# Patient Record
Sex: Female | Born: 1955
Health system: Southern US, Community
[De-identification: ages and names within clinical notes are randomized; demographics above are authoritative.]

## PROBLEM LIST (undated history)

## (undated) DIAGNOSIS — J45909 Unspecified asthma, uncomplicated: Secondary | ICD-10-CM

## (undated) DIAGNOSIS — F32A Depression, unspecified: Secondary | ICD-10-CM

## (undated) HISTORY — PX: KNEE SURGERY: SHX244

## (undated) HISTORY — PX: SHOULDER SURGERY: SHX246

## (undated) HISTORY — DX: Unspecified asthma, uncomplicated: J45.909

## (undated) HISTORY — PX: CHOLECYSTECTOMY: SHX55

## (undated) HISTORY — PX: ABDOMINAL HYSTERECTOMY: SHX81

## (undated) HISTORY — PX: FOOT SURGERY: SHX648

## (undated) HISTORY — DX: Depression, unspecified: F32.A

---

## 1998-03-10 ENCOUNTER — Inpatient Hospital Stay (HOSPITAL_COMMUNITY): Admission: EM | Admit: 1998-03-10 | Discharge: 1998-03-22 | Payer: Self-pay | Admitting: Specialist

## 1998-10-15 ENCOUNTER — Encounter: Payer: Self-pay | Admitting: Obstetrics and Gynecology

## 1998-10-17 ENCOUNTER — Inpatient Hospital Stay (HOSPITAL_COMMUNITY): Admission: RE | Admit: 1998-10-17 | Discharge: 1998-10-19 | Payer: Self-pay | Admitting: Obstetrics and Gynecology

## 2000-01-08 ENCOUNTER — Inpatient Hospital Stay (HOSPITAL_COMMUNITY): Admission: EM | Admit: 2000-01-08 | Discharge: 2000-01-19 | Payer: Self-pay | Admitting: *Deleted

## 2000-05-10 ENCOUNTER — Inpatient Hospital Stay (HOSPITAL_COMMUNITY): Admission: EM | Admit: 2000-05-10 | Discharge: 2000-05-31 | Payer: Self-pay | Admitting: *Deleted

## 2015-07-29 DIAGNOSIS — M2042 Other hammer toe(s) (acquired), left foot: Secondary | ICD-10-CM

## 2015-07-29 DIAGNOSIS — E119 Type 2 diabetes mellitus without complications: Secondary | ICD-10-CM

## 2015-07-29 DIAGNOSIS — L600 Ingrowing nail: Secondary | ICD-10-CM | POA: Insufficient documentation

## 2015-07-29 DIAGNOSIS — M2041 Other hammer toe(s) (acquired), right foot: Secondary | ICD-10-CM

## 2015-07-29 HISTORY — DX: Type 2 diabetes mellitus without complications: E11.9

## 2015-07-29 HISTORY — DX: Ingrowing nail: L60.0

## 2015-07-29 HISTORY — DX: Other hammer toe(s) (acquired), right foot: M20.41

## 2015-07-29 HISTORY — DX: Other hammer toe(s) (acquired), left foot: M20.42

## 2016-02-26 DIAGNOSIS — D239 Other benign neoplasm of skin, unspecified: Secondary | ICD-10-CM

## 2016-02-26 HISTORY — DX: Other benign neoplasm of skin, unspecified: D23.9

## 2016-03-18 DIAGNOSIS — L720 Epidermal cyst: Secondary | ICD-10-CM | POA: Insufficient documentation

## 2016-03-18 HISTORY — DX: Epidermal cyst: L72.0

## 2016-08-20 DIAGNOSIS — E119 Type 2 diabetes mellitus without complications: Secondary | ICD-10-CM

## 2016-08-20 DIAGNOSIS — J9620 Acute and chronic respiratory failure, unspecified whether with hypoxia or hypercapnia: Secondary | ICD-10-CM

## 2016-08-20 DIAGNOSIS — J441 Chronic obstructive pulmonary disease with (acute) exacerbation: Secondary | ICD-10-CM

## 2016-08-20 DIAGNOSIS — M549 Dorsalgia, unspecified: Secondary | ICD-10-CM

## 2016-08-20 DIAGNOSIS — E669 Obesity, unspecified: Secondary | ICD-10-CM | POA: Diagnosis not present

## 2016-08-20 DIAGNOSIS — F419 Anxiety disorder, unspecified: Secondary | ICD-10-CM

## 2016-08-21 DIAGNOSIS — E669 Obesity, unspecified: Secondary | ICD-10-CM | POA: Diagnosis not present

## 2016-08-21 DIAGNOSIS — G43909 Migraine, unspecified, not intractable, without status migrainosus: Secondary | ICD-10-CM | POA: Diagnosis not present

## 2016-08-21 DIAGNOSIS — J9620 Acute and chronic respiratory failure, unspecified whether with hypoxia or hypercapnia: Secondary | ICD-10-CM | POA: Diagnosis not present

## 2016-08-21 DIAGNOSIS — J441 Chronic obstructive pulmonary disease with (acute) exacerbation: Secondary | ICD-10-CM | POA: Diagnosis not present

## 2016-08-21 DIAGNOSIS — F419 Anxiety disorder, unspecified: Secondary | ICD-10-CM | POA: Diagnosis not present

## 2016-08-21 DIAGNOSIS — E119 Type 2 diabetes mellitus without complications: Secondary | ICD-10-CM | POA: Diagnosis not present

## 2016-08-21 DIAGNOSIS — M549 Dorsalgia, unspecified: Secondary | ICD-10-CM | POA: Diagnosis not present

## 2016-08-22 DIAGNOSIS — J440 Chronic obstructive pulmonary disease with acute lower respiratory infection: Secondary | ICD-10-CM

## 2016-08-22 DIAGNOSIS — J441 Chronic obstructive pulmonary disease with (acute) exacerbation: Secondary | ICD-10-CM | POA: Diagnosis not present

## 2016-08-22 DIAGNOSIS — E119 Type 2 diabetes mellitus without complications: Secondary | ICD-10-CM | POA: Diagnosis not present

## 2017-02-12 DIAGNOSIS — J438 Other emphysema: Secondary | ICD-10-CM

## 2017-02-12 DIAGNOSIS — Z8673 Personal history of transient ischemic attack (TIA), and cerebral infarction without residual deficits: Secondary | ICD-10-CM

## 2017-02-12 DIAGNOSIS — I119 Hypertensive heart disease without heart failure: Secondary | ICD-10-CM

## 2017-02-12 DIAGNOSIS — F17200 Nicotine dependence, unspecified, uncomplicated: Secondary | ICD-10-CM

## 2017-02-12 DIAGNOSIS — E785 Hyperlipidemia, unspecified: Secondary | ICD-10-CM

## 2017-02-12 DIAGNOSIS — I10 Essential (primary) hypertension: Secondary | ICD-10-CM

## 2017-02-12 HISTORY — DX: Personal history of transient ischemic attack (TIA), and cerebral infarction without residual deficits: Z86.73

## 2017-02-12 HISTORY — DX: Essential (primary) hypertension: I10

## 2017-02-12 HISTORY — DX: Other emphysema: J43.8

## 2017-02-12 HISTORY — DX: Nicotine dependence, unspecified, uncomplicated: F17.200

## 2017-02-12 HISTORY — DX: Hypertensive heart disease without heart failure: I11.9

## 2017-02-12 HISTORY — DX: Hyperlipidemia, unspecified: E78.5

## 2017-09-02 DIAGNOSIS — J101 Influenza due to other identified influenza virus with other respiratory manifestations: Secondary | ICD-10-CM

## 2017-09-02 DIAGNOSIS — R0602 Shortness of breath: Secondary | ICD-10-CM

## 2018-02-23 DIAGNOSIS — I10 Essential (primary) hypertension: Secondary | ICD-10-CM | POA: Diagnosis not present

## 2018-02-23 DIAGNOSIS — E1165 Type 2 diabetes mellitus with hyperglycemia: Secondary | ICD-10-CM

## 2018-02-23 DIAGNOSIS — Z720 Tobacco use: Secondary | ICD-10-CM | POA: Diagnosis not present

## 2018-02-23 DIAGNOSIS — J441 Chronic obstructive pulmonary disease with (acute) exacerbation: Secondary | ICD-10-CM

## 2018-02-23 DIAGNOSIS — G4733 Obstructive sleep apnea (adult) (pediatric): Secondary | ICD-10-CM | POA: Diagnosis not present

## 2018-02-23 DIAGNOSIS — E785 Hyperlipidemia, unspecified: Secondary | ICD-10-CM

## 2018-07-17 ENCOUNTER — Encounter: Payer: Self-pay | Admitting: Internal Medicine

## 2018-08-30 DIAGNOSIS — B349 Viral infection, unspecified: Secondary | ICD-10-CM | POA: Diagnosis not present

## 2018-08-30 DIAGNOSIS — J441 Chronic obstructive pulmonary disease with (acute) exacerbation: Secondary | ICD-10-CM | POA: Diagnosis not present

## 2018-08-30 DIAGNOSIS — Z720 Tobacco use: Secondary | ICD-10-CM

## 2018-08-30 DIAGNOSIS — R0603 Acute respiratory distress: Secondary | ICD-10-CM

## 2018-08-30 DIAGNOSIS — I10 Essential (primary) hypertension: Secondary | ICD-10-CM

## 2018-08-30 DIAGNOSIS — E1169 Type 2 diabetes mellitus with other specified complication: Secondary | ICD-10-CM

## 2018-08-31 DIAGNOSIS — R0603 Acute respiratory distress: Secondary | ICD-10-CM | POA: Diagnosis not present

## 2018-08-31 DIAGNOSIS — B349 Viral infection, unspecified: Secondary | ICD-10-CM | POA: Diagnosis not present

## 2018-08-31 DIAGNOSIS — I10 Essential (primary) hypertension: Secondary | ICD-10-CM | POA: Diagnosis not present

## 2018-08-31 DIAGNOSIS — J441 Chronic obstructive pulmonary disease with (acute) exacerbation: Secondary | ICD-10-CM | POA: Diagnosis not present

## 2018-09-01 DIAGNOSIS — J441 Chronic obstructive pulmonary disease with (acute) exacerbation: Secondary | ICD-10-CM | POA: Diagnosis not present

## 2018-09-01 DIAGNOSIS — I10 Essential (primary) hypertension: Secondary | ICD-10-CM | POA: Diagnosis not present

## 2018-09-01 DIAGNOSIS — R0603 Acute respiratory distress: Secondary | ICD-10-CM | POA: Diagnosis not present

## 2018-09-01 DIAGNOSIS — B349 Viral infection, unspecified: Secondary | ICD-10-CM | POA: Diagnosis not present

## 2019-02-14 DIAGNOSIS — F17210 Nicotine dependence, cigarettes, uncomplicated: Secondary | ICD-10-CM | POA: Diagnosis not present

## 2019-02-14 DIAGNOSIS — F411 Generalized anxiety disorder: Secondary | ICD-10-CM | POA: Diagnosis not present

## 2019-02-14 DIAGNOSIS — G4730 Sleep apnea, unspecified: Secondary | ICD-10-CM | POA: Diagnosis not present

## 2019-02-14 DIAGNOSIS — J449 Chronic obstructive pulmonary disease, unspecified: Secondary | ICD-10-CM | POA: Diagnosis not present

## 2019-02-14 DIAGNOSIS — J310 Chronic rhinitis: Secondary | ICD-10-CM | POA: Diagnosis not present

## 2019-02-23 DIAGNOSIS — J449 Chronic obstructive pulmonary disease, unspecified: Secondary | ICD-10-CM | POA: Diagnosis not present

## 2019-02-25 DIAGNOSIS — J449 Chronic obstructive pulmonary disease, unspecified: Secondary | ICD-10-CM | POA: Diagnosis not present

## 2019-03-02 DIAGNOSIS — J449 Chronic obstructive pulmonary disease, unspecified: Secondary | ICD-10-CM | POA: Diagnosis not present

## 2019-03-20 DIAGNOSIS — Z6830 Body mass index (BMI) 30.0-30.9, adult: Secondary | ICD-10-CM | POA: Diagnosis not present

## 2019-03-20 DIAGNOSIS — E2839 Other primary ovarian failure: Secondary | ICD-10-CM | POA: Diagnosis not present

## 2019-03-20 DIAGNOSIS — I10 Essential (primary) hypertension: Secondary | ICD-10-CM | POA: Diagnosis not present

## 2019-03-20 DIAGNOSIS — Z1231 Encounter for screening mammogram for malignant neoplasm of breast: Secondary | ICD-10-CM | POA: Diagnosis not present

## 2019-03-20 DIAGNOSIS — E1169 Type 2 diabetes mellitus with other specified complication: Secondary | ICD-10-CM | POA: Diagnosis not present

## 2019-03-20 DIAGNOSIS — J449 Chronic obstructive pulmonary disease, unspecified: Secondary | ICD-10-CM | POA: Diagnosis not present

## 2019-03-20 DIAGNOSIS — E785 Hyperlipidemia, unspecified: Secondary | ICD-10-CM | POA: Diagnosis not present

## 2019-03-22 DIAGNOSIS — F17210 Nicotine dependence, cigarettes, uncomplicated: Secondary | ICD-10-CM | POA: Diagnosis not present

## 2019-03-22 DIAGNOSIS — G4730 Sleep apnea, unspecified: Secondary | ICD-10-CM | POA: Diagnosis not present

## 2019-03-22 DIAGNOSIS — J449 Chronic obstructive pulmonary disease, unspecified: Secondary | ICD-10-CM | POA: Diagnosis not present

## 2019-03-22 DIAGNOSIS — J310 Chronic rhinitis: Secondary | ICD-10-CM | POA: Diagnosis not present

## 2019-03-22 DIAGNOSIS — F411 Generalized anxiety disorder: Secondary | ICD-10-CM | POA: Diagnosis not present

## 2019-03-25 DIAGNOSIS — J449 Chronic obstructive pulmonary disease, unspecified: Secondary | ICD-10-CM | POA: Diagnosis not present

## 2019-03-27 DIAGNOSIS — E785 Hyperlipidemia, unspecified: Secondary | ICD-10-CM | POA: Diagnosis not present

## 2019-03-27 DIAGNOSIS — M810 Age-related osteoporosis without current pathological fracture: Secondary | ICD-10-CM | POA: Diagnosis not present

## 2019-03-27 DIAGNOSIS — M85851 Other specified disorders of bone density and structure, right thigh: Secondary | ICD-10-CM | POA: Diagnosis not present

## 2019-03-27 DIAGNOSIS — Z79899 Other long term (current) drug therapy: Secondary | ICD-10-CM | POA: Diagnosis not present

## 2019-03-27 DIAGNOSIS — E1169 Type 2 diabetes mellitus with other specified complication: Secondary | ICD-10-CM | POA: Diagnosis not present

## 2019-03-27 DIAGNOSIS — J449 Chronic obstructive pulmonary disease, unspecified: Secondary | ICD-10-CM | POA: Diagnosis not present

## 2019-03-27 DIAGNOSIS — E2839 Other primary ovarian failure: Secondary | ICD-10-CM | POA: Diagnosis not present

## 2019-04-04 DIAGNOSIS — J449 Chronic obstructive pulmonary disease, unspecified: Secondary | ICD-10-CM | POA: Diagnosis not present

## 2019-04-05 DIAGNOSIS — R809 Proteinuria, unspecified: Secondary | ICD-10-CM | POA: Diagnosis not present

## 2019-04-14 DIAGNOSIS — G4733 Obstructive sleep apnea (adult) (pediatric): Secondary | ICD-10-CM | POA: Diagnosis not present

## 2019-04-14 DIAGNOSIS — J449 Chronic obstructive pulmonary disease, unspecified: Secondary | ICD-10-CM | POA: Diagnosis not present

## 2019-04-14 DIAGNOSIS — I10 Essential (primary) hypertension: Secondary | ICD-10-CM | POA: Diagnosis not present

## 2019-04-17 DIAGNOSIS — I739 Peripheral vascular disease, unspecified: Secondary | ICD-10-CM

## 2019-04-17 DIAGNOSIS — G8929 Other chronic pain: Secondary | ICD-10-CM | POA: Diagnosis not present

## 2019-04-17 DIAGNOSIS — M79674 Pain in right toe(s): Secondary | ICD-10-CM | POA: Diagnosis not present

## 2019-04-17 DIAGNOSIS — E1142 Type 2 diabetes mellitus with diabetic polyneuropathy: Secondary | ICD-10-CM | POA: Insufficient documentation

## 2019-04-17 DIAGNOSIS — E1165 Type 2 diabetes mellitus with hyperglycemia: Secondary | ICD-10-CM | POA: Diagnosis not present

## 2019-04-17 HISTORY — DX: Other chronic pain: G89.29

## 2019-04-17 HISTORY — DX: Peripheral vascular disease, unspecified: I73.9

## 2019-04-17 HISTORY — DX: Pain in right toe(s): M79.674

## 2019-04-17 HISTORY — DX: Type 2 diabetes mellitus with hyperglycemia: E11.42

## 2019-04-25 DIAGNOSIS — J449 Chronic obstructive pulmonary disease, unspecified: Secondary | ICD-10-CM | POA: Diagnosis not present

## 2019-04-27 DIAGNOSIS — J449 Chronic obstructive pulmonary disease, unspecified: Secondary | ICD-10-CM | POA: Diagnosis not present

## 2019-04-28 DIAGNOSIS — J449 Chronic obstructive pulmonary disease, unspecified: Secondary | ICD-10-CM | POA: Diagnosis not present

## 2019-05-19 DIAGNOSIS — J301 Allergic rhinitis due to pollen: Secondary | ICD-10-CM | POA: Diagnosis not present

## 2019-05-19 DIAGNOSIS — F411 Generalized anxiety disorder: Secondary | ICD-10-CM | POA: Diagnosis not present

## 2019-05-19 DIAGNOSIS — J310 Chronic rhinitis: Secondary | ICD-10-CM | POA: Diagnosis not present

## 2019-05-19 DIAGNOSIS — J449 Chronic obstructive pulmonary disease, unspecified: Secondary | ICD-10-CM | POA: Diagnosis not present

## 2019-05-19 DIAGNOSIS — G4730 Sleep apnea, unspecified: Secondary | ICD-10-CM | POA: Diagnosis not present

## 2019-05-19 DIAGNOSIS — J9611 Chronic respiratory failure with hypoxia: Secondary | ICD-10-CM | POA: Diagnosis not present

## 2019-05-19 DIAGNOSIS — F17290 Nicotine dependence, other tobacco product, uncomplicated: Secondary | ICD-10-CM | POA: Diagnosis not present

## 2019-05-25 DIAGNOSIS — J449 Chronic obstructive pulmonary disease, unspecified: Secondary | ICD-10-CM | POA: Diagnosis not present

## 2019-05-27 DIAGNOSIS — J449 Chronic obstructive pulmonary disease, unspecified: Secondary | ICD-10-CM | POA: Diagnosis not present

## 2019-06-19 DIAGNOSIS — M810 Age-related osteoporosis without current pathological fracture: Secondary | ICD-10-CM | POA: Diagnosis not present

## 2019-06-19 DIAGNOSIS — I10 Essential (primary) hypertension: Secondary | ICD-10-CM | POA: Diagnosis not present

## 2019-06-19 DIAGNOSIS — E669 Obesity, unspecified: Secondary | ICD-10-CM | POA: Diagnosis not present

## 2019-06-19 DIAGNOSIS — Z6830 Body mass index (BMI) 30.0-30.9, adult: Secondary | ICD-10-CM | POA: Diagnosis not present

## 2019-06-19 DIAGNOSIS — E785 Hyperlipidemia, unspecified: Secondary | ICD-10-CM | POA: Diagnosis not present

## 2019-06-19 DIAGNOSIS — R600 Localized edema: Secondary | ICD-10-CM | POA: Diagnosis not present

## 2019-06-19 DIAGNOSIS — E1169 Type 2 diabetes mellitus with other specified complication: Secondary | ICD-10-CM | POA: Diagnosis not present

## 2019-06-19 DIAGNOSIS — J449 Chronic obstructive pulmonary disease, unspecified: Secondary | ICD-10-CM | POA: Diagnosis not present

## 2019-06-19 DIAGNOSIS — M898X9 Other specified disorders of bone, unspecified site: Secondary | ICD-10-CM | POA: Diagnosis not present

## 2019-06-19 DIAGNOSIS — Z79899 Other long term (current) drug therapy: Secondary | ICD-10-CM | POA: Diagnosis not present

## 2019-06-25 DIAGNOSIS — J449 Chronic obstructive pulmonary disease, unspecified: Secondary | ICD-10-CM | POA: Diagnosis not present

## 2019-06-27 DIAGNOSIS — J449 Chronic obstructive pulmonary disease, unspecified: Secondary | ICD-10-CM | POA: Diagnosis not present

## 2019-07-26 DIAGNOSIS — J9611 Chronic respiratory failure with hypoxia: Secondary | ICD-10-CM | POA: Diagnosis not present

## 2019-07-26 DIAGNOSIS — G4730 Sleep apnea, unspecified: Secondary | ICD-10-CM | POA: Diagnosis not present

## 2019-07-26 DIAGNOSIS — F411 Generalized anxiety disorder: Secondary | ICD-10-CM | POA: Diagnosis not present

## 2019-07-26 DIAGNOSIS — J449 Chronic obstructive pulmonary disease, unspecified: Secondary | ICD-10-CM | POA: Diagnosis not present

## 2019-07-26 DIAGNOSIS — J310 Chronic rhinitis: Secondary | ICD-10-CM | POA: Diagnosis not present

## 2019-07-26 DIAGNOSIS — F17290 Nicotine dependence, other tobacco product, uncomplicated: Secondary | ICD-10-CM | POA: Diagnosis not present

## 2019-07-28 DIAGNOSIS — J449 Chronic obstructive pulmonary disease, unspecified: Secondary | ICD-10-CM | POA: Diagnosis not present

## 2019-08-06 DIAGNOSIS — J449 Chronic obstructive pulmonary disease, unspecified: Secondary | ICD-10-CM | POA: Diagnosis not present

## 2019-08-06 DIAGNOSIS — G4733 Obstructive sleep apnea (adult) (pediatric): Secondary | ICD-10-CM | POA: Diagnosis not present

## 2019-08-06 DIAGNOSIS — I10 Essential (primary) hypertension: Secondary | ICD-10-CM | POA: Diagnosis not present

## 2019-08-10 DIAGNOSIS — J449 Chronic obstructive pulmonary disease, unspecified: Secondary | ICD-10-CM | POA: Diagnosis not present

## 2019-08-25 DIAGNOSIS — J449 Chronic obstructive pulmonary disease, unspecified: Secondary | ICD-10-CM | POA: Diagnosis not present

## 2019-08-31 DIAGNOSIS — J449 Chronic obstructive pulmonary disease, unspecified: Secondary | ICD-10-CM | POA: Diagnosis not present

## 2019-08-31 DIAGNOSIS — E782 Mixed hyperlipidemia: Secondary | ICD-10-CM | POA: Diagnosis not present

## 2019-08-31 DIAGNOSIS — M79603 Pain in arm, unspecified: Secondary | ICD-10-CM | POA: Diagnosis not present

## 2019-08-31 DIAGNOSIS — M818 Other osteoporosis without current pathological fracture: Secondary | ICD-10-CM | POA: Diagnosis not present

## 2019-08-31 DIAGNOSIS — F17200 Nicotine dependence, unspecified, uncomplicated: Secondary | ICD-10-CM | POA: Diagnosis not present

## 2019-08-31 DIAGNOSIS — I10 Essential (primary) hypertension: Secondary | ICD-10-CM | POA: Diagnosis not present

## 2019-08-31 DIAGNOSIS — E559 Vitamin D deficiency, unspecified: Secondary | ICD-10-CM | POA: Diagnosis not present

## 2019-09-19 DIAGNOSIS — R918 Other nonspecific abnormal finding of lung field: Secondary | ICD-10-CM | POA: Diagnosis not present

## 2019-09-19 DIAGNOSIS — F17210 Nicotine dependence, cigarettes, uncomplicated: Secondary | ICD-10-CM | POA: Diagnosis not present

## 2019-09-19 DIAGNOSIS — J439 Emphysema, unspecified: Secondary | ICD-10-CM | POA: Diagnosis not present

## 2019-09-19 DIAGNOSIS — I700 Atherosclerosis of aorta: Secondary | ICD-10-CM | POA: Diagnosis not present

## 2019-09-22 DIAGNOSIS — Z79899 Other long term (current) drug therapy: Secondary | ICD-10-CM | POA: Diagnosis not present

## 2019-09-22 DIAGNOSIS — G4730 Sleep apnea, unspecified: Secondary | ICD-10-CM | POA: Diagnosis not present

## 2019-09-22 DIAGNOSIS — F411 Generalized anxiety disorder: Secondary | ICD-10-CM | POA: Diagnosis not present

## 2019-09-22 DIAGNOSIS — J9611 Chronic respiratory failure with hypoxia: Secondary | ICD-10-CM | POA: Diagnosis not present

## 2019-09-22 DIAGNOSIS — Z1231 Encounter for screening mammogram for malignant neoplasm of breast: Secondary | ICD-10-CM | POA: Diagnosis not present

## 2019-09-22 DIAGNOSIS — F17210 Nicotine dependence, cigarettes, uncomplicated: Secondary | ICD-10-CM | POA: Diagnosis not present

## 2019-09-22 DIAGNOSIS — J449 Chronic obstructive pulmonary disease, unspecified: Secondary | ICD-10-CM | POA: Diagnosis not present

## 2019-09-22 DIAGNOSIS — F17290 Nicotine dependence, other tobacco product, uncomplicated: Secondary | ICD-10-CM | POA: Diagnosis not present

## 2019-09-22 DIAGNOSIS — J310 Chronic rhinitis: Secondary | ICD-10-CM | POA: Diagnosis not present

## 2019-09-25 DIAGNOSIS — J449 Chronic obstructive pulmonary disease, unspecified: Secondary | ICD-10-CM | POA: Diagnosis not present

## 2019-09-27 DIAGNOSIS — E1165 Type 2 diabetes mellitus with hyperglycemia: Secondary | ICD-10-CM | POA: Diagnosis not present

## 2019-09-27 DIAGNOSIS — R109 Unspecified abdominal pain: Secondary | ICD-10-CM | POA: Diagnosis not present

## 2019-09-27 DIAGNOSIS — N3001 Acute cystitis with hematuria: Secondary | ICD-10-CM | POA: Diagnosis not present

## 2019-09-27 DIAGNOSIS — R1084 Generalized abdominal pain: Secondary | ICD-10-CM | POA: Diagnosis not present

## 2019-09-27 DIAGNOSIS — R0902 Hypoxemia: Secondary | ICD-10-CM | POA: Diagnosis not present

## 2019-09-27 DIAGNOSIS — R0602 Shortness of breath: Secondary | ICD-10-CM | POA: Diagnosis not present

## 2019-09-27 DIAGNOSIS — K5900 Constipation, unspecified: Secondary | ICD-10-CM | POA: Diagnosis not present

## 2019-09-27 DIAGNOSIS — I10 Essential (primary) hypertension: Secondary | ICD-10-CM | POA: Diagnosis not present

## 2019-09-27 DIAGNOSIS — E119 Type 2 diabetes mellitus without complications: Secondary | ICD-10-CM | POA: Diagnosis not present

## 2019-09-27 DIAGNOSIS — J449 Chronic obstructive pulmonary disease, unspecified: Secondary | ICD-10-CM | POA: Diagnosis not present

## 2019-09-27 DIAGNOSIS — B9689 Other specified bacterial agents as the cause of diseases classified elsewhere: Secondary | ICD-10-CM | POA: Diagnosis not present

## 2019-09-29 DIAGNOSIS — F17200 Nicotine dependence, unspecified, uncomplicated: Secondary | ICD-10-CM | POA: Diagnosis not present

## 2019-09-29 DIAGNOSIS — I10 Essential (primary) hypertension: Secondary | ICD-10-CM | POA: Diagnosis not present

## 2019-09-29 DIAGNOSIS — H53461 Homonymous bilateral field defects, right side: Secondary | ICD-10-CM | POA: Diagnosis not present

## 2019-09-29 DIAGNOSIS — J449 Chronic obstructive pulmonary disease, unspecified: Secondary | ICD-10-CM | POA: Diagnosis not present

## 2019-09-29 DIAGNOSIS — M79603 Pain in arm, unspecified: Secondary | ICD-10-CM | POA: Diagnosis not present

## 2019-09-29 DIAGNOSIS — E559 Vitamin D deficiency, unspecified: Secondary | ICD-10-CM | POA: Diagnosis not present

## 2019-09-29 DIAGNOSIS — E782 Mixed hyperlipidemia: Secondary | ICD-10-CM | POA: Diagnosis not present

## 2019-10-02 DIAGNOSIS — J449 Chronic obstructive pulmonary disease, unspecified: Secondary | ICD-10-CM | POA: Diagnosis not present

## 2019-10-25 DIAGNOSIS — J449 Chronic obstructive pulmonary disease, unspecified: Secondary | ICD-10-CM | POA: Diagnosis not present

## 2019-10-28 DIAGNOSIS — R0902 Hypoxemia: Secondary | ICD-10-CM | POA: Diagnosis not present

## 2019-10-28 DIAGNOSIS — R519 Headache, unspecified: Secondary | ICD-10-CM | POA: Diagnosis not present

## 2019-10-28 DIAGNOSIS — R739 Hyperglycemia, unspecified: Secondary | ICD-10-CM | POA: Diagnosis not present

## 2019-10-28 DIAGNOSIS — R52 Pain, unspecified: Secondary | ICD-10-CM | POA: Diagnosis not present

## 2019-10-28 DIAGNOSIS — J9691 Respiratory failure, unspecified with hypoxia: Secondary | ICD-10-CM | POA: Diagnosis not present

## 2019-10-28 DIAGNOSIS — J449 Chronic obstructive pulmonary disease, unspecified: Secondary | ICD-10-CM | POA: Diagnosis not present

## 2019-10-28 DIAGNOSIS — G43909 Migraine, unspecified, not intractable, without status migrainosus: Secondary | ICD-10-CM | POA: Diagnosis not present

## 2019-10-28 DIAGNOSIS — R7989 Other specified abnormal findings of blood chemistry: Secondary | ICD-10-CM | POA: Diagnosis not present

## 2019-10-28 DIAGNOSIS — G4489 Other headache syndrome: Secondary | ICD-10-CM | POA: Diagnosis not present

## 2019-10-28 DIAGNOSIS — R778 Other specified abnormalities of plasma proteins: Secondary | ICD-10-CM | POA: Diagnosis not present

## 2019-10-28 DIAGNOSIS — R9431 Abnormal electrocardiogram [ECG] [EKG]: Secondary | ICD-10-CM | POA: Diagnosis not present

## 2019-10-28 DIAGNOSIS — R197 Diarrhea, unspecified: Secondary | ICD-10-CM | POA: Diagnosis not present

## 2019-10-28 DIAGNOSIS — D751 Secondary polycythemia: Secondary | ICD-10-CM | POA: Diagnosis not present

## 2019-10-28 DIAGNOSIS — J441 Chronic obstructive pulmonary disease with (acute) exacerbation: Secondary | ICD-10-CM | POA: Diagnosis not present

## 2019-10-28 DIAGNOSIS — J9621 Acute and chronic respiratory failure with hypoxia: Secondary | ICD-10-CM | POA: Diagnosis not present

## 2019-10-28 DIAGNOSIS — E1165 Type 2 diabetes mellitus with hyperglycemia: Secondary | ICD-10-CM | POA: Diagnosis not present

## 2019-10-28 DIAGNOSIS — I10 Essential (primary) hypertension: Secondary | ICD-10-CM | POA: Diagnosis not present

## 2019-10-28 DIAGNOSIS — E785 Hyperlipidemia, unspecified: Secondary | ICD-10-CM

## 2019-10-28 DIAGNOSIS — D72829 Elevated white blood cell count, unspecified: Secondary | ICD-10-CM | POA: Diagnosis not present

## 2019-10-28 DIAGNOSIS — R1110 Vomiting, unspecified: Secondary | ICD-10-CM | POA: Diagnosis not present

## 2019-10-28 DIAGNOSIS — R112 Nausea with vomiting, unspecified: Secondary | ICD-10-CM | POA: Diagnosis not present

## 2019-10-29 DIAGNOSIS — E1165 Type 2 diabetes mellitus with hyperglycemia: Secondary | ICD-10-CM | POA: Diagnosis not present

## 2019-10-29 DIAGNOSIS — I10 Essential (primary) hypertension: Secondary | ICD-10-CM | POA: Diagnosis not present

## 2019-10-29 DIAGNOSIS — I517 Cardiomegaly: Secondary | ICD-10-CM | POA: Diagnosis not present

## 2019-10-29 DIAGNOSIS — E785 Hyperlipidemia, unspecified: Secondary | ICD-10-CM | POA: Diagnosis not present

## 2019-10-29 DIAGNOSIS — D72829 Elevated white blood cell count, unspecified: Secondary | ICD-10-CM | POA: Diagnosis not present

## 2019-10-29 DIAGNOSIS — J449 Chronic obstructive pulmonary disease, unspecified: Secondary | ICD-10-CM | POA: Diagnosis not present

## 2019-10-29 DIAGNOSIS — G43909 Migraine, unspecified, not intractable, without status migrainosus: Secondary | ICD-10-CM | POA: Diagnosis not present

## 2019-10-29 DIAGNOSIS — R739 Hyperglycemia, unspecified: Secondary | ICD-10-CM | POA: Diagnosis not present

## 2019-10-29 DIAGNOSIS — J441 Chronic obstructive pulmonary disease with (acute) exacerbation: Secondary | ICD-10-CM | POA: Diagnosis not present

## 2019-10-29 DIAGNOSIS — R1110 Vomiting, unspecified: Secondary | ICD-10-CM | POA: Diagnosis not present

## 2019-10-29 DIAGNOSIS — R9431 Abnormal electrocardiogram [ECG] [EKG]: Secondary | ICD-10-CM | POA: Diagnosis not present

## 2019-10-29 DIAGNOSIS — R7989 Other specified abnormal findings of blood chemistry: Secondary | ICD-10-CM | POA: Diagnosis not present

## 2019-10-29 DIAGNOSIS — J9621 Acute and chronic respiratory failure with hypoxia: Secondary | ICD-10-CM

## 2019-10-29 DIAGNOSIS — D751 Secondary polycythemia: Secondary | ICD-10-CM | POA: Diagnosis not present

## 2019-10-31 DIAGNOSIS — J449 Chronic obstructive pulmonary disease, unspecified: Secondary | ICD-10-CM | POA: Diagnosis not present

## 2019-11-03 DIAGNOSIS — I10 Essential (primary) hypertension: Secondary | ICD-10-CM | POA: Diagnosis not present

## 2019-11-03 DIAGNOSIS — J449 Chronic obstructive pulmonary disease, unspecified: Secondary | ICD-10-CM | POA: Diagnosis not present

## 2019-11-03 DIAGNOSIS — G4733 Obstructive sleep apnea (adult) (pediatric): Secondary | ICD-10-CM | POA: Diagnosis not present

## 2019-11-06 DIAGNOSIS — K5900 Constipation, unspecified: Secondary | ICD-10-CM | POA: Diagnosis not present

## 2019-11-06 DIAGNOSIS — E1165 Type 2 diabetes mellitus with hyperglycemia: Secondary | ICD-10-CM | POA: Diagnosis not present

## 2019-11-06 DIAGNOSIS — R140 Abdominal distension (gaseous): Secondary | ICD-10-CM | POA: Diagnosis not present

## 2019-11-06 DIAGNOSIS — I10 Essential (primary) hypertension: Secondary | ICD-10-CM | POA: Diagnosis not present

## 2019-11-06 DIAGNOSIS — K760 Fatty (change of) liver, not elsewhere classified: Secondary | ICD-10-CM | POA: Diagnosis not present

## 2019-11-06 DIAGNOSIS — R1012 Left upper quadrant pain: Secondary | ICD-10-CM | POA: Diagnosis not present

## 2019-11-06 DIAGNOSIS — R1084 Generalized abdominal pain: Secondary | ICD-10-CM | POA: Diagnosis not present

## 2019-11-06 DIAGNOSIS — R1032 Left lower quadrant pain: Secondary | ICD-10-CM | POA: Diagnosis not present

## 2019-11-06 DIAGNOSIS — R062 Wheezing: Secondary | ICD-10-CM | POA: Diagnosis not present

## 2020-11-13 ENCOUNTER — Encounter: Payer: Self-pay | Admitting: Cardiology

## 2020-11-13 ENCOUNTER — Encounter: Payer: Self-pay | Admitting: *Deleted

## 2020-11-13 DIAGNOSIS — E7800 Pure hypercholesterolemia, unspecified: Secondary | ICD-10-CM

## 2020-11-13 DIAGNOSIS — N751 Abscess of Bartholin's gland: Secondary | ICD-10-CM

## 2020-11-13 DIAGNOSIS — G43909 Migraine, unspecified, not intractable, without status migrainosus: Secondary | ICD-10-CM

## 2020-11-13 DIAGNOSIS — G2581 Restless legs syndrome: Secondary | ICD-10-CM

## 2020-11-13 DIAGNOSIS — K219 Gastro-esophageal reflux disease without esophagitis: Secondary | ICD-10-CM | POA: Insufficient documentation

## 2020-11-13 HISTORY — DX: Migraine, unspecified, not intractable, without status migrainosus: G43.909

## 2020-11-13 HISTORY — DX: Pure hypercholesterolemia, unspecified: E78.00

## 2020-11-13 HISTORY — DX: Abscess of Bartholin's gland: N75.1

## 2020-11-13 HISTORY — DX: Restless legs syndrome: G25.81

## 2020-11-13 HISTORY — DX: Gastro-esophageal reflux disease without esophagitis: K21.9

## 2020-12-05 DIAGNOSIS — Z955 Presence of coronary angioplasty implant and graft: Secondary | ICD-10-CM

## 2020-12-05 DIAGNOSIS — I213 ST elevation (STEMI) myocardial infarction of unspecified site: Secondary | ICD-10-CM

## 2020-12-05 DIAGNOSIS — I2510 Atherosclerotic heart disease of native coronary artery without angina pectoris: Secondary | ICD-10-CM | POA: Insufficient documentation

## 2020-12-05 DIAGNOSIS — R092 Respiratory arrest: Secondary | ICD-10-CM

## 2020-12-05 HISTORY — DX: Presence of coronary angioplasty implant and graft: Z95.5

## 2020-12-05 HISTORY — DX: Respiratory arrest: R09.2

## 2020-12-05 HISTORY — DX: Atherosclerotic heart disease of native coronary artery without angina pectoris: I25.10

## 2020-12-05 HISTORY — DX: ST elevation (STEMI) myocardial infarction of unspecified site: I21.3

## 2020-12-13 NOTE — Progress Notes (Signed)
Cardiology Office Note:    Date:  12/16/2020   ID:  JAHLISA ROSSITTO, DOB March 05, 1956, MRN [id number]  PCP:  Aldona Bar, MD  Cardiologist:  Shirlee More, MD   Referring MD: Gardiner Rhyme, MD  ASSESSMENT:    1. Coronary artery disease of native artery of native heart with stable angina pectoris ([place])   2. LV dysfunction   3. Hypertensive heart disease, unspecified whether heart failure present   4. Hypercholesterolemia   5. Pulmonary emphysema, unspecified emphysema type ([place])    PLAN:    In order of problems listed above:  She had very complex course of STEMI associated with respiratory arrest mechanical intubation and subsequent PCI and stenting of right coronary artery with severe LV dysfunction.  I would like to repeat her echocardiogram as soon as we can and if EF is less than 48 5 to 50% transition from lisinopril to Entresto continue beta-blocker add SGLT2 inhibitor and MRA.  I stressed her the importance of uninterrupted dual antiplatelet therapy. Recheck her labs fasting the day of her echocardiogram continue her high intensity statin Stable managed by pulmonary  including continuous oxygen treatment She is working with pulmonary and her primary care physician smoking cessation  Next appointment 6 weeks   Medication Adjustments/Labs and Tests Ordered: Current medicines are reviewed at length with the patient today.  Concerns regarding medicines are outlined above.  No orders of the defined types were placed in this encounter.  No orders of the defined types were placed in this encounter.    Chief Complaint  Patient presents with   Follow-up   Coronary Artery Disease     History of Present Illness:    ALEIAH MOHAMMED is a 65 y.o. female with a background history of COPD and obstructive sleep apnea and previous substance abuse who is being seen today for the evaluation of CAD at the request of Gardiner Rhyme, MD.  She was admitted to [facility] service 12/05/2020-12/08/2020 with acute coronary syndrome ST elevation MI with PCI and stent to the proximal mid and distal right coronary artery.  She was received in transfer from [facility] where she initially had acute respiratory failure in the setting of Xanax overdose she was intubated transferred to [facility] hospital with inferior ST segment elevation MI.  Ejection fraction 30 to 35% with a dilated left ventricle and inferior lateral wall hypokinesia on echocardiogram.  Other problems included type 2 diabetes  Chest CT without contrast 10/21/2020 showed findings of emphysema aortic and coronary artery atherosclerosis  She presented to [facility] 11/05/2020 with a history of abdominal pain and constipation.  Blood pressure 167/83 heart rate 92 bpm oxygen saturation 97% on 2 L of oxygen.  White count elevated 11,600 hemoglobin 15.9 CT of the abdomen and pelvis showed a moderate volume of stool in the colon and minimal distal small bowel loops fluid-filled.  She had an echocardiogram at [facility] 10/29/2019 showing normal left ventricular size mild concentric LVH normal systolic function EF 55 to 60% with indeterminate filling pressure the right ventricle showed mild thickening normal size normal systolic function left atrium is mildly enlarged and there is no significant valvular abnormality.  Her EKG that day showed sinus rhythm and T wave abnormality consider anterior ischemia.  She tells me that she does not except that she had a Xanax overdose. She rarely takes oxycodone and never takes Xanax with it. She has no memory of events prior to the ER why  the ambulance was called to her home. She has been on continuous oxygen for a decade and she is at her usual level of shortness of breath still able to do ADLs. She is diffusely weak No chest pain wheezing palpitation or syncope. Her baseline lipid profile 06/19/2019 cholesterol 256 LDL 150 triglycerides 354 HDL  40 A1c 10.6%. Lipid profile 11 days ago cholesterol 244 triglycerides 491 HDL 38 LDL not reported. Past Medical History:  Diagnosis Date   Asthma    Bartholin's gland abscess 11/13/2020   Benign neoplasm of skin or subcutaneous tissue 02/26/2016   Chronic toe pain, right foot 04/17/2019   Formatting of this note might be different from the original. First and second toes right   Depression    Diabetes mellitus without complication ([place]) 6/54/6503   Dyslipidemia 02/12/2017   Essential hypertension 02/12/2017   GERD (gastroesophageal reflux disease) 11/13/2020   H/O: CVA (cerebrovascular accident) 02/12/2017   Hammer toes of both feet 07/29/2015   Hypercholesterolemia 11/13/2020   Inclusion cyst 03/18/2016   Ingrowing nail 07/29/2015   Migraine headache 11/13/2020   Other emphysema ([place]) 02/12/2017   PAD (peripheral artery disease) ([place]) 04/17/2019   Poorly controlled type 2 diabetes mellitus with peripheral neuropathy ([place]) 04/17/2019   RLS (restless legs syndrome) 11/13/2020    Past Surgical History:  Procedure Laterality Date   ABDOMINAL HYSTERECTOMY     CHOLECYSTECTOMY     FOOT SURGERY     KNEE SURGERY Right    SHOULDER SURGERY Left     Current Medications: Current Meds  Medication Sig   albuterol (VENTOLIN HFA) 108 (90 Base) MCG/ACT inhaler Inhale 2 puffs into the lungs every 6 (six) hours as needed for wheezing or shortness of breath.   ALPRAZolam (XANAX) 0.5 MG tablet Take 0.5 mg by mouth as needed for anxiety.   ALPRAZolam (XANAX) 0.5 MG tablet Take 0.5 mg by mouth at bedtime as needed for sleep or anxiety.   aspirin 81 MG chewable tablet Chew 1 tablet by mouth daily.   atorvastatin (LIPITOR) 80 MG tablet Take 80 mg by mouth daily.   clopidogrel (PLAVIX) 75 MG tablet Take 1 tablet by mouth daily.   dicyclomine (BENTYL) 20 MG tablet Take 20 mg by mouth daily.   EPINEPHrine 0.3 mg/0.3 mL IJ SOAJ injection Inject 0.3 mg into the muscle as needed for anaphylaxis.   esomeprazole (NEXIUM) 20 MG  capsule Take 20 mg by mouth daily at 12 noon.   fluticasone (FLONASE) 50 MCG/ACT nasal spray Place 1 spray into both nostrils 2 (two) times daily as needed for allergies.   Fluticasone-Umeclidin-Vilant (TRELEGY ELLIPTA) 100-62.5-25 MCG/INH AEPB Inhale 1 puff into the lungs daily.   furosemide (LASIX) 20 MG tablet Take 20 mg by mouth daily as needed for fluid or edema.   guaiFENesin (MUCINEX) 600 MG 12 hr tablet Take 600 mg by mouth 2 (two) times daily.   ipratropium-albuterol (DUONEB) 0.5-2.5 (3) MG/3ML SOLN Take 3 mLs by nebulization 4 (four) times daily as needed for shortness of breath.   lisinopril (ZESTRIL) 10 MG tablet Take 1 tablet by mouth daily.   lisinopril (ZESTRIL) 40 MG tablet Take 40 mg by mouth daily.   metFORMIN (GLUCOPHAGE) 500 MG tablet Take 500 mg by mouth 3 times/day as needed-between meals & bedtime.   metoprolol succinate (TOPROL-XL) 25 MG 24 hr tablet Take 1 tablet by mouth daily.   Nicotine 21-14-7 MG/24HR KIT Place 1 patch onto the skin as directed.   nitroGLYCERIN (NITROSTAT) 0.4 MG SL  tablet Place 1 tablet under the tongue every 5 (five) minutes x 3 doses as needed for chest pain.   ondansetron (ZOFRAN) 8 MG tablet Take 8 mg by mouth as needed for nausea.   ondansetron (ZOFRAN) 8 MG tablet Take 1 tablet by mouth every 8 (eight) hours as needed for nausea/vomiting.   OXYGEN Inhale into the lungs. 2 liters nasal canula contious   predniSONE (DELTASONE) 20 MG tablet Take 20 mg by mouth daily with breakfast.   Tiotropium Bromide Monohydrate (SPIRIVA RESPIMAT) 2.5 MCG/ACT AERS Inhale 2 puffs into the lungs daily.   topiramate (TOPAMAX) 25 MG tablet Take 25 mg by mouth daily.     Allergies:   Morphine, Hydrocodone-acetaminophen, Other, Chantix [varenicline], Ibuprofen, Imitrex [sumatriptan], Naprosyn [naproxen], Tylenol [acetaminophen], and Codeine   Social History   Socioeconomic History   Marital status: Widowed    Spouse name: Not on file   Number of children: Not  on file   Years of education: Not on file   Highest education level: Not on file  Occupational History   Not on file  Tobacco Use   Smoking status: Every Day    Packs/day: 2.00    Years: 60.00    Pack years: 120.00    Types: Cigarettes   Smokeless tobacco: Never  Vaping Use   Vaping Use: Never used  Substance and Sexual Activity   Alcohol use: Not Currently    Comment: Occasionally will have a 1 beer   Drug use: Yes    Types: Marijuana   Sexual activity: Not on file  Other Topics Concern   Not on file  Social History Narrative   Not on file   Social Determinants of Health   Financial Resource Strain: Not on file  Food Insecurity: Not on file  Transportation Needs: Not on file  Physical Activity: Not on file  Stress: Not on file  Social Connections: Not on file     Family History: The patient's family history includes Alcoholism in her brother; Diabetes in her brother, mother, and sister; Failure to thrive in her brother; Heart attack in her sister; Heart attack (age of onset: 8) in her mother; Hypertension in her brother, mother, and sister; Other in her father; Stroke in her brother.  ROS:   ROS Please see the history of present illness.     All other systems reviewed and are negative.  EKGs/Labs/Other Studies Reviewed:    The following studies were reviewed today:   EKG:  EKG is  ordered today.  The ekg ordered today is personally reviewed and demonstrates sinus rhythm baseline artifact nonspecific T waves   Physical Exam:    VS:  BP 114/60 (BP Location: Left Arm, Patient Position: Sitting, Cuff Size: Normal)   Pulse 96   Ht _0  (1.549 m)   Wt 153 lb (69.4 kg)   SpO2 94%   BMI 28.91 kg/m     Wt Readings from Last 3 Encounters:  12/16/20 153 lb (69.4 kg)  11/01/20 181 lb (82.1 kg)     GEN: Looks chronically ill and debilitated alert in no acute distress HEENT: Normal NECK: No JVD; No carotid bruits LYMPHATICS: No lymphadenopathy CARDIAC: RRR,  no murmurs, rubs, gallops distant heart sounds RESPIRATORY: Hyperinflated no rales or wheezing ABDOMEN: Soft, non-tender, non-distended MUSCULOSKELETAL:  No edema; No deformity  SKIN: Warm and dry NEUROLOGIC:  Alert and oriented x 3 PSYCHIATRIC:  Normal affect     Signed, Shirlee More, MD  12/16/2020 11:25 AM  [facility]

## 2020-12-16 ENCOUNTER — Encounter: Payer: Self-pay | Admitting: Cardiology

## 2020-12-16 ENCOUNTER — Ambulatory Visit (INDEPENDENT_AMBULATORY_CARE_PROVIDER_SITE_OTHER): Payer: Medicare HMO | Admitting: Cardiology

## 2020-12-16 ENCOUNTER — Other Ambulatory Visit: Payer: Self-pay

## 2020-12-16 VITALS — BP 114/60 | HR 96 | Ht 61.0 in | Wt 153.0 lb

## 2020-12-16 DIAGNOSIS — I119 Hypertensive heart disease without heart failure: Secondary | ICD-10-CM | POA: Diagnosis not present

## 2020-12-16 DIAGNOSIS — I519 Heart disease, unspecified: Secondary | ICD-10-CM | POA: Diagnosis not present

## 2020-12-16 DIAGNOSIS — J439 Emphysema, unspecified: Secondary | ICD-10-CM

## 2020-12-16 DIAGNOSIS — I25118 Atherosclerotic heart disease of native coronary artery with other forms of angina pectoris: Secondary | ICD-10-CM | POA: Diagnosis not present

## 2020-12-16 DIAGNOSIS — E7800 Pure hypercholesterolemia, unspecified: Secondary | ICD-10-CM | POA: Diagnosis not present

## 2020-12-16 NOTE — Patient Instructions (Signed)
Medication Instructions:  Your physician recommends that you continue on your current medications as directed. Please refer to the Current Medication list given to you today.  *If you need a refill on your cardiac medications before your next appointment, please call your pharmacy*   Lab Work: Your physician recommends that you have a CMP, Lipids and ProBNP today.  If you have labs (blood work) drawn today and your tests are completely normal, you will receive your results only by: [place] (if you have MyChart) OR A paper copy in the mail If you have any lab test that is abnormal or we need to change your treatment, we will call you to review the results.   Testing/Procedures: Your physician has requested that you have an echocardiogram. Echocardiography is a painless test that uses sound waves to create images of your heart. It provides your doctor with information about the size and shape of your heart and how well your heart's chambers and valves are working. This procedure takes approximately one hour. There are no restrictions for this procedure.    Follow-Up: At [facility], you and your health needs are our priority.  As part of our continuing mission to provide you with exceptional heart care, we have created designated Provider Care Teams.  These Care Teams include your primary Cardiologist (physician) and Advanced Practice Providers (APPs -  Physician Assistants and Nurse Practitioners) who all work together to provide you with the care you need, when you need it.  We recommend signing up for the patient portal called "MyChart".  Sign up information is provided on this After Visit Summary.  MyChart is used to connect with patients for Virtual Visits (Telemedicine).  Patients are able to view lab/test results, encounter notes, upcoming appointments, etc.  Non-urgent messages can be sent to your provider as well.   To learn more about what you can do with MyChart, go to  [email or web address].    Your next appointment:   1 week(s)  The format for your next appointment:   In Person  Provider:   Shirlee More, MD   Other Instructions NA

## 2021-01-02 ENCOUNTER — Telehealth: Payer: Self-pay

## 2021-01-02 ENCOUNTER — Other Ambulatory Visit: Payer: Self-pay

## 2021-01-02 ENCOUNTER — Ambulatory Visit (INDEPENDENT_AMBULATORY_CARE_PROVIDER_SITE_OTHER): Payer: Medicare HMO

## 2021-01-02 DIAGNOSIS — I25118 Atherosclerotic heart disease of native coronary artery with other forms of angina pectoris: Secondary | ICD-10-CM

## 2021-01-02 DIAGNOSIS — I519 Heart disease, unspecified: Secondary | ICD-10-CM | POA: Diagnosis not present

## 2021-01-02 LAB — ECHOCARDIOGRAM COMPLETE
Calc EF: 26.9 %
S' Lateral: 5.1 cm
Single Plane A2C EF: 29.1 %
Single Plane A4C EF: 23.1 %

## 2021-01-02 MED ORDER — SACUBITRIL-VALSARTAN 49-51 MG PO TABS: 1.0000 | ORAL_TABLET | Freq: Two times a day (BID) | ORAL | 3 refills | Status: DC

## 2021-01-02 NOTE — Telephone Encounter (Signed)
Left message on patients voicemail to please return our call.   

## 2021-01-02 NOTE — Progress Notes (Signed)
Complete echocardiogram performed.  Jimmy Brittania Sudbeck RDCS, RVT  

## 2021-01-02 NOTE — Telephone Encounter (Signed)
-----   Message from Richardo Priest, MD sent at 01/02/2021  2:58 PM EDT ----- As I suspected she has a severe reduction in her heart muscle function  We should discontinue lisinopril wait 3 full days 3 doses and then place her in mid range Entresto 49/51.

## 2021-01-02 NOTE — Telephone Encounter (Signed)
Spoke with patient regarding results and recommendation.  Patient verbalizes understanding and is agreeable to plan of care. Advised patient to call back with any issues or concerns.  

## 2021-01-27 DIAGNOSIS — F32A Depression, unspecified: Secondary | ICD-10-CM | POA: Insufficient documentation

## 2021-01-27 DIAGNOSIS — J45909 Unspecified asthma, uncomplicated: Secondary | ICD-10-CM | POA: Insufficient documentation

## 2021-01-27 NOTE — Progress Notes (Signed)
Cardiology Office Note:    Date:  01/28/2021   ID:  Casey Ramos, DOB 17-Oct-1955, MRN [id number]  PCP:  Aldona Bar, MD  Cardiologist:  Shirlee More, MD    Referring MD: Aldona Bar, MD    ASSESSMENT:    1. Coronary artery disease of native artery of native heart with stable angina pectoris ([place])   2. Hypertensive heart disease, unspecified whether heart failure present   3. LV dysfunction   4. Hypercholesterolemia   5. Pulmonary emphysema, unspecified emphysema type ([facility])    PLAN:    In order of problems listed above:  My greatest concern is here noncompliance with medications I reviewed with her that she has CAD with recent PCI and severe left ventricular dysfunction and is at very high risk of adverse outcome she voiced understanding that she would take medications were refilled today.  I am unsure what to make of this constant low-level tightness in her chest with suspected pulmonary etiology we will check an office EKG today unless she has acute ischemic changes do not think she requires hospitalization or repeat coronary angiography. Unfortunately off guideline directed therapy I asked her to resume beta-blocker and Entresto we will see back in the office in 6 weeks if tolerating uptitrate Entresto and placed on SGLT2 inhibitor.  We will need to recheck an echocardiogram in 3 months if EF remains severely reduced raise the issue of ICD.  I have also been asked to use a 7-day event monitor to screen for VT Resume the statin for 1 month we will check labs including lipid profile She has very severe COPD severe functional impairment and will continue her current bronchodilators and oxygen treatment   Next appointment: 4 weeks   Medication Adjustments/Labs and Tests Ordered: Current medicines are reviewed at length with the patient today.  Concerns regarding medicines are outlined above.  No orders of the defined types were placed in this encounter.  No orders of  the defined types were placed in this encounter.   Chief Complaint  Patient presents with   Follow-up   Coronary Artery Disease   Cardiomyopathy    History of Present Illness:    Casey Ramos is a 65 y.o. female with a hx of CAD cardiomyopathy ejection fraction 30 to 35% COPD obstructive sleep apnea and previous substance abuse last seen 12/16/2020 following admission [facility] service 12/05/2020 with acute coronary syndrome ST elevation MI with PCI and stent to the mid and distal right coronary artery.She was admitted to [facility] service 12/05/2020-12/08/2020 with acute coronary syndrome ST elevation MI with PCI and stent to the proximal mid and distal right coronary artery.  She was received in transfer from [facility] where she initially had acute respiratory failure in the setting of Xanax overdose she was intubated transferred to [facility] hospital with inferior ST segment elevation MI.  Ejection fraction 30 to 35% with a dilated left ventricle and inferior lateral wall hypokinesia on echocardiogram.  Other problems included type 2 diabetes   Chest CT without contrast 10/21/2020 showed findings of emphysema aortic and coronary artery atherosclerosis   She presented to [facility] ED 11/05/2020 with a history of abdominal pain and constipation.  Blood pressure 167/83 heart rate 92 bpm oxygen saturation 97% on 2 L of oxygen.  White count elevated 11,600 hemoglobin 15.9 CT of the abdomen and pelvis showed a moderate volume of stool in the colon and minimal distal small bowel loops fluid-filled.  She  had an echocardiogram at [facility] 10/29/2019 showing normal left ventricular size mild concentric LVH normal systolic function EF 55 to 60% with indeterminate filling pressure the right ventricle showed mild thickening normal size normal systolic function left atrium is mildly enlarged and there is no significant valvular abnormality.  Her EKG that  day showed sinus rhythm and T wave abnormality consider anterior ischemia.  Compliance with diet, lifestyle and medications: Yes  I reviewed medication she is not taking clopidogrel statin beta-blocker diuretic or Entresto.  I asked whyshe is that her prescriptions have run out. Clinically she is unchanged she is short of breath with any activities wears continuous oxygen sats were 92 to 94% patient requires assistance for bathing and household activities. Since PCI and stent she has constant mild tightness in her chest never resolves never gets worse and is associated with wheezing.  No edema orthopnea syncope. She had an echocardiogram performed 01/02/2021 which showed persistent left ventricular dysfunction EF 30 to 35% normal right ventricular function and akinesia of the mid and distal inferior wall mid inferolateral segment mid inferoseptal and apical septal segment. Past Medical History:  Diagnosis Date   Asthma    Bartholin's gland abscess 11/13/2020   Benign neoplasm of skin or subcutaneous tissue 02/26/2016   Chronic toe pain, right foot 04/17/2019   Formatting of this note might be different from the original. First and second toes right   Depression    Diabetes mellitus without complication ([place]) 123456   Dyslipidemia 02/12/2017   Essential hypertension 02/12/2017   GERD (gastroesophageal reflux disease) 11/13/2020   H/O: CVA (cerebrovascular accident) 02/12/2017   Hammer toes of both feet 07/29/2015   Hypercholesterolemia 11/13/2020   Inclusion cyst 03/18/2016   Ingrowing nail 07/29/2015   Migraine headache 11/13/2020   Other emphysema ([place]) 02/12/2017   PAD (peripheral artery disease) ([place]) 04/17/2019   Poorly controlled type 2 diabetes mellitus with peripheral neuropathy ([place]) 04/17/2019   RLS (restless legs syndrome) 11/13/2020    Past Surgical History:  Procedure Laterality Date   ABDOMINAL HYSTERECTOMY     CHOLECYSTECTOMY     FOOT SURGERY     KNEE SURGERY Right    SHOULDER SURGERY  Left     Current Medications: Current Meds  Medication Sig   albuterol (VENTOLIN HFA) 108 (90 Base) MCG/ACT inhaler Inhale 2 puffs into the lungs every 6 (six) hours as needed for wheezing or shortness of breath.   ALPRAZolam (XANAX) 0.5 MG tablet Take 0.5 mg by mouth 2 (two) times daily as needed for anxiety.   aspirin 81 MG chewable tablet Chew 1 tablet by mouth daily.   dicyclomine (BENTYL) 20 MG tablet Take 20 mg by mouth daily.   EPINEPHrine 0.3 mg/0.3 mL IJ SOAJ injection Inject 0.3 mg into the muscle as needed for anaphylaxis.   fluticasone (FLONASE) 50 MCG/ACT nasal spray Place 1 spray into both nostrils 2 (two) times daily as needed for allergies.   Fluticasone-Umeclidin-Vilant (TRELEGY ELLIPTA) 100-62.5-25 MCG/INH AEPB Inhale 1 puff into the lungs daily.   ipratropium-albuterol (DUONEB) 0.5-2.5 (3) MG/3ML SOLN Take 3 mLs by nebulization 4 (four) times daily as needed for shortness of breath.   nitroGLYCERIN (NITROSTAT) 0.4 MG SL tablet Place 1 tablet under the tongue every 5 (five) minutes x 3 doses as needed for chest pain.   ondansetron (ZOFRAN) 8 MG tablet Take 8 mg by mouth as needed for nausea.   oxyCODONE (OXY IR/ROXICODONE) 5 MG immediate release tablet Take 5 mg by mouth 4 (four)  times daily as needed for severe pain.   OXYGEN Inhale into the lungs. 2 liters nasal canula contious     Allergies:   Morphine, Hydrocodone-acetaminophen, Other, Chantix [varenicline], Ibuprofen, Imitrex [sumatriptan], Naprosyn [naproxen], Tylenol [acetaminophen], and Codeine   Social History   Socioeconomic History   Marital status: Widowed    Spouse name: Not on file   Number of children: Not on file   Years of education: Not on file   Highest education level: Not on file  Occupational History   Not on file  Tobacco Use   Smoking status: Every Day    Packs/day: 2.00    Years: 60.00    Pack years: 120.00    Types: Cigarettes   Smokeless tobacco: Never  Vaping Use   Vaping Use:  Never used  Substance and Sexual Activity   Alcohol use: Not Currently    Comment: Occasionally will have a 1 beer   Drug use: Yes    Types: Marijuana   Sexual activity: Not on file  Other Topics Concern   Not on file  Social History Narrative   Not on file   Social Determinants of Health   Financial Resource Strain: Not on file  Food Insecurity: Not on file  Transportation Needs: Not on file  Physical Activity: Not on file  Stress: Not on file  Social Connections: Not on file     Family History: The patient's family history includes Alcoholism in her brother; Diabetes in her brother, mother, and sister; Failure to thrive in her brother; Heart attack in her sister; Heart attack (age of onset: 61) in her mother; Hypertension in her brother, mother, and sister; Other in her father; Stroke in her brother. ROS:   Please see the history of present illness.    All other systems reviewed and are negative.  EKGs/Labs/Other Studies Reviewed:    The following studies were reviewed today: EKG performed today in the office personally viewed sinus rhythm T wave inversion inferior anterior lateral leads unchanged from previous EKG 12/17/2020   Recent Labs: No results found for requested labs within last 8760 hours.  Recent Lipid Panel No results found for: CHOL, TRIG, HDL, CHOLHDL, VLDL, LDLCALC, LDLDIRECT  Physical Exam:    VS:  BP 130/80 (BP Location: Left Arm, Patient Position: Sitting, Cuff Size: Normal)   Pulse 82   Ht '5\' 1"'$  (1.549 m)   Wt 149 lb (67.6 kg)   SpO2 94%   BMI 28.15 kg/m     Wt Readings from Last 3 Encounters:  01/28/21 149 lb (67.6 kg)  12/16/20 153 lb (69.4 kg)  11/01/20 181 lb (82.1 kg)     GEN: She does not look chronically ill or debilitated well nourished, well developed in no acute distress HEENT: Normal NECK: No JVD; No carotid bruits LYMPHATICS: No lymphadenopathy CARDIAC: Distant heart sounds RRR, no murmurs, rubs, gallops RESPIRATORY:  Hyperinflated diffusely diminished breath sounds ABDOMEN: Soft, non-tender, non-distended MUSCULOSKELETAL:  No edema; No deformity  SKIN: Warm and dry NEUROLOGIC:  Alert and oriented x 3 PSYCHIATRIC:  Normal affect    Signed, Shirlee More, MD  01/28/2021 11:01 AM    [place]

## 2021-01-28 ENCOUNTER — Ambulatory Visit (INDEPENDENT_AMBULATORY_CARE_PROVIDER_SITE_OTHER): Payer: Medicare HMO | Admitting: Cardiology

## 2021-01-28 ENCOUNTER — Ambulatory Visit (INDEPENDENT_AMBULATORY_CARE_PROVIDER_SITE_OTHER): Payer: Medicare HMO

## 2021-01-28 ENCOUNTER — Encounter: Payer: Self-pay | Admitting: Cardiology

## 2021-01-28 ENCOUNTER — Other Ambulatory Visit: Payer: Self-pay

## 2021-01-28 VITALS — BP 130/80 | HR 82 | Ht 61.0 in | Wt 149.0 lb

## 2021-01-28 DIAGNOSIS — E7800 Pure hypercholesterolemia, unspecified: Secondary | ICD-10-CM | POA: Diagnosis not present

## 2021-01-28 DIAGNOSIS — I119 Hypertensive heart disease without heart failure: Secondary | ICD-10-CM

## 2021-01-28 DIAGNOSIS — I519 Heart disease, unspecified: Secondary | ICD-10-CM

## 2021-01-28 DIAGNOSIS — I25118 Atherosclerotic heart disease of native coronary artery with other forms of angina pectoris: Secondary | ICD-10-CM

## 2021-01-28 DIAGNOSIS — R079 Chest pain, unspecified: Secondary | ICD-10-CM | POA: Diagnosis not present

## 2021-01-28 DIAGNOSIS — J439 Emphysema, unspecified: Secondary | ICD-10-CM

## 2021-01-28 MED ORDER — SACUBITRIL-VALSARTAN 49-51 MG PO TABS: 1.0000 | ORAL_TABLET | Freq: Two times a day (BID) | ORAL | 3 refills | Status: DC

## 2021-01-28 MED ORDER — ATORVASTATIN CALCIUM 80 MG PO TABS: 80.0000 mg | ORAL_TABLET | Freq: Every day | ORAL | 3 refills | Status: AC

## 2021-01-28 MED ORDER — FUROSEMIDE 20 MG PO TABS: 20.0000 mg | ORAL_TABLET | Freq: Every day | ORAL | 3 refills | Status: AC | PRN

## 2021-01-28 MED ORDER — METOPROLOL SUCCINATE ER 25 MG PO TB24: 25.0000 mg | ORAL_TABLET | Freq: Every day | ORAL | 3 refills | Status: DC

## 2021-01-28 MED ORDER — CLOPIDOGREL BISULFATE 75 MG PO TABS: 75.0000 mg | ORAL_TABLET | Freq: Every day | ORAL | 3 refills | Status: AC

## 2021-01-28 NOTE — Patient Instructions (Signed)
Medication Instructions:  Your physician recommends that you continue on your current medications as directed. Please refer to the Current Medication list given to you today Refilled: Entresto, Toprol-XL, Lasix, Plavix, Lipitor *If you need a refill on your cardiac medications before your next appointment, please call your pharmacy*   Lab Work: Your physician recommends that you return for lab work in:  In 1 month: CMP, Lipids, ProBNP If you have labs (blood work) drawn today and your tests are completely normal, you will receive your results only by: [place] (if you have [place]) OR A paper copy in the mail If you have any lab test that is abnormal or we need to change your treatment, we will call you to review the results.   Testing/Procedures: A zio monitor was ordered today. It will remain on for 7 days. You will then return monitor and event diary in provided box. It takes 1-2 weeks for report to be downloaded and returned to [place]. We will call you with the results. If monitor falls off or has orange flashing light, please call Zio for further instructions.   ZIO  WHY IS MY DOCTOR PRESCRIBING ZIO? The Zio system is proven and trusted by physicians to detect and diagnose irregular heart rhythms -- and has been prescribed to hundreds of thousands of patients.  The FDA has cleared the Zio system to monitor for many different kinds of irregular heart rhythms. In a study, physicians were able to reach a diagnosis 90% of the time with the Zio system1.  You can wear the Zio monitor -- a small, discreet, comfortable patch -- during your normal day-to-day activity, including while you sleep, shower, and exercise, while it records every single heartbeat for analysis.  1Barrett, P., et al. Comparison of 24 Hour Holter Monitoring Versus 14 Day Novel Adhesive Patch Electrocardiographic Monitoring. [place], 2014.  ZIO VS. HOLTER MONITORING The Zio monitor can be  comfortably worn for up to 14 days. Holter monitors can be worn for 24 to 48 hours, limiting the time to record any irregular heart rhythms you may have. Zio is able to capture data for the 51% of patients who have their first symptom-triggered arrhythmia after 48 hours.1  LIVE WITHOUT RESTRICTIONS The Zio ambulatory cardiac monitor is a small, unobtrusive, and water-resistant patch--you might even forget you're wearing it. The Zio monitor records and stores every beat of your heart, whether you're sleeping, working out, or showering. Remove on: August 30th 2022    Follow-Up: At [facility], you and your health needs are our priority.  As part of our continuing mission to provide you with exceptional heart care, we have created designated Provider Care Teams.  These Care Teams include your primary Cardiologist (physician) and Advanced Practice Providers (APPs -  Physician Assistants and Nurse Practitioners) who all work together to provide you with the care you need, when you need it.  We recommend signing up for the patient portal called "MyChart".  Sign up information is provided on this After Visit Summary.  MyChart is used to connect with patients for Virtual Visits (Telemedicine).  Patients are able to view lab/test results, encounter notes, upcoming appointments, etc.  Non-urgent messages can be sent to your provider as well.   To learn more about what you can do with MyChart, go to [email or web address].    Your next appointment:   6 week(s)  The format for your next appointment:   In Person  Provider:   Shirlee More, MD  Other Instructions

## 2021-02-04 DIAGNOSIS — I471 Supraventricular tachycardia: Secondary | ICD-10-CM

## 2021-02-04 DIAGNOSIS — R079 Chest pain, unspecified: Secondary | ICD-10-CM | POA: Diagnosis not present

## 2021-02-04 DIAGNOSIS — I519 Heart disease, unspecified: Secondary | ICD-10-CM | POA: Diagnosis not present

## 2021-02-17 ENCOUNTER — Telehealth: Payer: Self-pay

## 2021-02-17 DIAGNOSIS — I472 Ventricular tachycardia, unspecified: Secondary | ICD-10-CM

## 2021-02-17 NOTE — Telephone Encounter (Signed)
-----   Message from Richardo Priest, MD sent at 02/16/2021 12:52 PM EDT ----- Her monitor is concerning she has brief runs of ventricular tachycardia coronary disease recent myocardial infarction severely reduced ejection fraction I would like her seen quickly by EP.

## 2021-02-17 NOTE — Telephone Encounter (Signed)
Left message on patients voicemail to please return our call.   

## 2021-02-17 NOTE — Telephone Encounter (Signed)
Spoke with patient regarding results and recommendation.  Patient verbalizes understanding and is agreeable to plan of care. Advised patient to call back with any issues or concerns.  

## 2021-03-03 ENCOUNTER — Encounter: Payer: Self-pay | Admitting: Cardiology

## 2021-03-03 ENCOUNTER — Other Ambulatory Visit: Payer: Self-pay

## 2021-03-03 ENCOUNTER — Ambulatory Visit (INDEPENDENT_AMBULATORY_CARE_PROVIDER_SITE_OTHER): Payer: Medicare HMO | Admitting: Cardiology

## 2021-03-03 VITALS — BP 140/76 | HR 92 | Ht 61.0 in | Wt 155.6 lb

## 2021-03-03 DIAGNOSIS — Z01812 Encounter for preprocedural laboratory examination: Secondary | ICD-10-CM

## 2021-03-03 DIAGNOSIS — Z01818 Encounter for other preprocedural examination: Secondary | ICD-10-CM | POA: Diagnosis not present

## 2021-03-03 DIAGNOSIS — I255 Ischemic cardiomyopathy: Secondary | ICD-10-CM

## 2021-03-03 DIAGNOSIS — Z79899 Other long term (current) drug therapy: Secondary | ICD-10-CM | POA: Diagnosis not present

## 2021-03-03 MED ORDER — METOPROLOL SUCCINATE ER 50 MG PO TB24: 50.0000 mg | ORAL_TABLET | Freq: Every day | ORAL | 3 refills | Status: DC

## 2021-03-03 MED ORDER — SPIRONOLACTONE 25 MG PO TABS: 25.0000 mg | ORAL_TABLET | Freq: Every day | ORAL | 3 refills | Status: AC

## 2021-03-03 NOTE — Progress Notes (Signed)
Electrophysiology Office Note   Date:  03/03/2021   ID:  LYDIA TOREN, DOB 08/13/1955, MRN [id number]  PCP:  Aldona Bar, MD  Cardiologist:  Bettina Gavia Primary Electrophysiologist:  Handsome Anglin Meredith Leeds, MD    Chief Complaint: VT, CHF   History of Present Illness: Casey Ramos is a 65 y.o. female who is being seen today for the evaluation of VT, CHF at the request of Bettina Gavia, Hilton Cork, MD. Presenting today for electrophysiology evaluation.  She has a history significant for COPD, obstructive sleep apnea, prior substance abuse.  She was admitted to [facility] hospital 12/05/2020 to 12/08/2020 with acute ST elevation MI and stenting of the proximal mid and distal right coronary artery.  She initially presented to [facility] with acute respiratory distress in setting of Xanax overdose.  She was intubated and transferred to [facility] with ST segment elevations.  Ejection fraction was 30 to 35%.  She did have an echo at [facility] 10/29/2019 that showed a normal ejection fraction.  She currently uses oxygen at bedtime.  She rarely uses oxycodone never takes Xanax with it.  She does not remember her trip to the emergency room prior to her MRI.  Unfortunately, she has been somewhat noncompliant with her medications.    Today, she denies symptoms of palpitations, chest pain, shortness of breath, orthopnea, PND, lower extremity edema, claudication, dizziness, presyncope, syncope, bleeding, or neurologic sequela. The patient is tolerating medications without difficulties.  She continues to have mild chest discomfort that has been stable.  Is not associated with exertion.  She has been taking her medicines on a daily basis.  She does not note any major change in how she feels on his medications.  I spent quite a bit of time today discussing the importance of being compliant with her medications.  We spoke about the importance of her medications relation to her  coronary disease and heart failure.  She does have chronic shortness of breath related to her COPD.  She does continue to smoke and has no interest in quitting.   Past Medical History:  Diagnosis Date   Asthma    Bartholin's gland abscess 11/13/2020   Benign neoplasm of skin or subcutaneous tissue 02/26/2016   Chronic toe pain, right foot 04/17/2019   Formatting of this note might be different from the original. First and second toes right   Depression    Diabetes mellitus without complication ([place]) 12/31/3662   Dyslipidemia 02/12/2017   Essential hypertension 02/12/2017   GERD (gastroesophageal reflux disease) 11/13/2020   H/O: CVA (cerebrovascular accident) 02/12/2017   Hammer toes of both feet 07/29/2015   Hypercholesterolemia 11/13/2020   Inclusion cyst 03/18/2016   Ingrowing nail 07/29/2015   Migraine headache 11/13/2020   Other emphysema ([place]) 02/12/2017   PAD (peripheral artery disease) ([place]) 04/17/2019   Poorly controlled type 2 diabetes mellitus with peripheral neuropathy ([place]) 04/17/2019   RLS (restless legs syndrome) 11/13/2020   Past Surgical History:  Procedure Laterality Date   ABDOMINAL HYSTERECTOMY     CHOLECYSTECTOMY     FOOT SURGERY     KNEE SURGERY Right    SHOULDER SURGERY Left      Current Outpatient Medications  Medication Sig Dispense Refill   albuterol (VENTOLIN HFA) 108 (90 Base) MCG/ACT inhaler Inhale 2 puffs into the lungs every 6 (six) hours as needed for wheezing or shortness of breath.     ALPRAZolam (XANAX) 0.5 MG tablet Take 0.5 mg by mouth 2 (two)  times daily as needed for anxiety.     aspirin 81 MG chewable tablet Chew 1 tablet by mouth daily.     atorvastatin (LIPITOR) 80 MG tablet Take 1 tablet (80 mg total) by mouth daily. 90 tablet 3   clopidogrel (PLAVIX) 75 MG tablet Take 1 tablet (75 mg total) by mouth daily. 90 tablet 3   dicyclomine (BENTYL) 20 MG tablet Take 20 mg by mouth daily.     EPINEPHrine 0.3 mg/0.3 mL IJ SOAJ injection Inject 0.3 mg into the  muscle as needed for anaphylaxis.     fluticasone (FLONASE) 50 MCG/ACT nasal spray Place 1 spray into both nostrils 2 (two) times daily as needed for allergies.     Fluticasone-Umeclidin-Vilant (TRELEGY ELLIPTA) 100-62.5-25 MCG/INH AEPB Inhale 1 puff into the lungs daily.     furosemide (LASIX) 20 MG tablet Take 1 tablet (20 mg total) by mouth daily as needed for fluid or edema. 45 tablet 3   ipratropium-albuterol (DUONEB) 0.5-2.5 (3) MG/3ML SOLN Take 3 mLs by nebulization 4 (four) times daily as needed for shortness of breath.     metoprolol succinate (TOPROL-XL) 50 MG 24 hr tablet Take 1 tablet (50 mg total) by mouth daily. Take with or immediately following a meal. 90 tablet 3   nitroGLYCERIN (NITROSTAT) 0.4 MG SL tablet Place 1 tablet under the tongue every 5 (five) minutes x 3 doses as needed for chest pain.     ondansetron (ZOFRAN) 8 MG tablet Take 8 mg by mouth as needed for nausea.     oxyCODONE (OXY IR/ROXICODONE) 5 MG immediate release tablet Take 5 mg by mouth 4 (four) times daily as needed for severe pain.     OXYGEN Inhale into the lungs. 2 liters nasal canula contious     sacubitril-valsartan (ENTRESTO) 49-51 MG Take 1 tablet by mouth 2 (two) times daily. 180 tablet 3   spironolactone (ALDACTONE) 25 MG tablet Take 1 tablet (25 mg total) by mouth daily. 30 tablet 3   No current facility-administered medications for this visit.    Allergies:   Morphine, Hydrocodone-acetaminophen, Other, Chantix [varenicline], Ibuprofen, Imitrex [sumatriptan], Naprosyn [naproxen], Tylenol [acetaminophen], and Codeine   Social History:  The patient  reports that she has been smoking cigarettes. She has a 120.00 pack-year smoking history. She has never used smokeless tobacco. She reports that she does not currently use alcohol. She reports current drug use. Drug: Marijuana.   Family History:  The patient's family history includes Alcoholism in her brother; Diabetes in her brother, mother, and sister;  Failure to thrive in her brother; Heart attack in her sister; Heart attack (age of onset: 15) in her mother; Hypertension in her brother, mother, and sister; Other in her father; Stroke in her brother.    ROS:  Please see the history of present illness.   Otherwise, review of systems is positive for none.   All other systems are reviewed and negative.    PHYSICAL EXAM: VS:  BP 140/76   Pulse 92   Ht 5\' 1"  (1.549 m)   Wt 155 lb 9.6 oz (70.6 kg)   SpO2 94% Comment: with 2L of 02  BMI 29.40 kg/m  , BMI Body mass index is 29.4 kg/m. GEN: Well nourished, well developed, in no acute distress  HEENT: normal  Neck: no JVD, carotid bruits, or masses Cardiac: RRR; no murmurs, rubs, or gallops,no edema  Respiratory:  clear to auscultation bilaterally, normal work of breathing GI: soft, nontender, nondistended, + BS MS: no  deformity or atrophy  Skin: warm and dry Neuro:  Strength and sensation are intact Psych: euthymic mood, full affect  EKG:  EKG is ordered today. Personal review of the ekg ordered shows sinus rhythm, rate 92  Recent Labs: No results found for requested labs within last 8760 hours.    Lipid Panel  No results found for: CHOL, TRIG, HDL, CHOLHDL, VLDL, LDLCALC, LDLDIRECT   Wt Readings from Last 3 Encounters:  03/03/21 155 lb 9.6 oz (70.6 kg)  01/28/21 149 lb (67.6 kg)  12/16/20 153 lb (69.4 kg)      Other studies Reviewed: Additional studies/ records that were reviewed today include: TTE 01/02/21  Review of the above records today demonstrates:   1. Left ventricular ejection fraction, by estimation, is 30 to 35%. The  left ventricle has moderately decreased function. The left ventricle  demonstrates regional wall motion abnormalities (see scoring  diagram/findings for description). Left ventricular   diastolic parameters are consistent with Grade I diastolic dysfunction  (impaired relaxation).   2. Right ventricular systolic function is normal. The right  ventricular  size is normal. There is normal pulmonary artery systolic pressure.   3. The mitral valve is normal in structure. No evidence of mitral valve  regurgitation. No evidence of mitral stenosis.   4. The aortic valve is normal in structure. Aortic valve regurgitation is  trivial. No aortic stenosis is present.   5. The inferior vena cava is normal in size with greater than 50%  respiratory variability, suggesting right atrial pressure of 3 mmHg.   Cardiac monitor is 02/16/2021 personally reviewed There were 11 triggered and 8 diary events all sinus rhythm sinus tachycardia predominantly associated with PVCs. Ventricular ectopy was occasional 76 couplets 1 triplet and 3 episodes of nonsustained VT to the longest 37 complexes at rate of 150 bpm monomorphic. Supraventricular ectopy was rare and there were no episodes of atrial fibrillation or flutter  ASSESSMENT AND PLAN:  1.  Coronary artery disease: Status post inferior STEMI.  Stents placed to the RCA.  Continue Plavix 75 mg daily, aspirin 81 mg daily.  Chest pain has remained stable.  2.  Chronic systolic heart failure due to ischemic cardiomyopathy: Currently on Entresto 49-51 mg twice daily, Toprol-XL 25 mg daily.  We Jashiya Bassett increase her Toprol-XL to 50 mg and add Aldactone 25 mg to improve her heart failure control.  Due to her systolic heart failure and nonsustained VT, she would benefit from ICD implant.  Risks and benefits were discussed with bleeding, tamponade, infection, pneumothorax, lead dislodgment, renal failure, death.  She understands these risks and has agreed to the procedure.  3.  Hyperlipidemia: Continue atorvastatin 80 mg per primary cardiology.  4.  Tobacco abuse: Patient continues to smoke despite her coronary artery disease and COPD.  We had a long discussion about complete cessation.  She Dara Beidleman discuss this further with her pulmonologist at her next appointment later today.  Case discussed with primary  cardiology  Current medicines are reviewed at length with the patient today.   The patient does not have concerns regarding her medicines.  The following changes were made today:  none  Labs/ tests ordered today include:  Orders Placed This Encounter  Procedures   Basic metabolic panel   CBC   Basic metabolic panel   EKG 82-NOIB     Disposition:   FU with Velina Drollinger 3 months  Signed, Courtland Coppa Meredith Leeds, MD  03/03/2021 10:22 AM     [facility] [id number] [place] [address] [phone] (office) [phone] (fax)

## 2021-03-03 NOTE — H&P (View-Only) (Signed)
Electrophysiology Office Note   Date:  03/03/2021   ID:  KAMORI KITCHENS, DOB 1956-02-28, MRN [id number]  PCP:  Aldona Bar, MD  Cardiologist:  Bettina Gavia Primary Electrophysiologist:  Nhu Glasby Meredith Leeds, MD    Chief Complaint: VT, CHF   History of Present Illness: Casey Ramos is a 65 y.o. female who is being seen today for the evaluation of VT, CHF at the request of Bettina Gavia, Hilton Cork, MD. Presenting today for electrophysiology evaluation.  She has a history significant for COPD, obstructive sleep apnea, prior substance abuse.  She was admitted to [facility] hospital 12/05/2020 to 12/08/2020 with acute ST elevation MI and stenting of the proximal mid and distal right coronary artery.  She initially presented to [facility] with acute respiratory distress in setting of Xanax overdose.  She was intubated and transferred to [facility] with ST segment elevations.  Ejection fraction was 30 to 35%.  She did have an echo at [facility] 10/29/2019 that showed a normal ejection fraction.  She currently uses oxygen at bedtime.  She rarely uses oxycodone never takes Xanax with it.  She does not remember her trip to the emergency room prior to her MRI.  Unfortunately, she has been somewhat noncompliant with her medications.    Today, she denies symptoms of palpitations, chest pain, shortness of breath, orthopnea, PND, lower extremity edema, claudication, dizziness, presyncope, syncope, bleeding, or neurologic sequela. The patient is tolerating medications without difficulties.  She continues to have mild chest discomfort that has been stable.  Is not associated with exertion.  She has been taking her medicines on a daily basis.  She does not note any major change in how she feels on his medications.  I spent quite a bit of time today discussing the importance of being compliant with her medications.  We spoke about the importance of her medications relation to her  coronary disease and heart failure.  She does have chronic shortness of breath related to her COPD.  She does continue to smoke and has no interest in quitting.   Past Medical History:  Diagnosis Date   Asthma    Bartholin's gland abscess 11/13/2020   Benign neoplasm of skin or subcutaneous tissue 02/26/2016   Chronic toe pain, right foot 04/17/2019   Formatting of this note might be different from the original. First and second toes right   Depression    Diabetes mellitus without complication ([place]) 12/14/6281   Dyslipidemia 02/12/2017   Essential hypertension 02/12/2017   GERD (gastroesophageal reflux disease) 11/13/2020   H/O: CVA (cerebrovascular accident) 02/12/2017   Hammer toes of both feet 07/29/2015   Hypercholesterolemia 11/13/2020   Inclusion cyst 03/18/2016   Ingrowing nail 07/29/2015   Migraine headache 11/13/2020   Other emphysema ([place]) 02/12/2017   PAD (peripheral artery disease) ([place]) 04/17/2019   Poorly controlled type 2 diabetes mellitus with peripheral neuropathy ([place]) 04/17/2019   RLS (restless legs syndrome) 11/13/2020   Past Surgical History:  Procedure Laterality Date   ABDOMINAL HYSTERECTOMY     CHOLECYSTECTOMY     FOOT SURGERY     KNEE SURGERY Right    SHOULDER SURGERY Left      Current Outpatient Medications  Medication Sig Dispense Refill   albuterol (VENTOLIN HFA) 108 (90 Base) MCG/ACT inhaler Inhale 2 puffs into the lungs every 6 (six) hours as needed for wheezing or shortness of breath.     ALPRAZolam (XANAX) 0.5 MG tablet Take 0.5 mg by mouth 2 (two)  times daily as needed for anxiety.     aspirin 81 MG chewable tablet Chew 1 tablet by mouth daily.     atorvastatin (LIPITOR) 80 MG tablet Take 1 tablet (80 mg total) by mouth daily. 90 tablet 3   clopidogrel (PLAVIX) 75 MG tablet Take 1 tablet (75 mg total) by mouth daily. 90 tablet 3   dicyclomine (BENTYL) 20 MG tablet Take 20 mg by mouth daily.     EPINEPHrine 0.3 mg/0.3 mL IJ SOAJ injection Inject 0.3 mg into the  muscle as needed for anaphylaxis.     fluticasone (FLONASE) 50 MCG/ACT nasal spray Place 1 spray into both nostrils 2 (two) times daily as needed for allergies.     Fluticasone-Umeclidin-Vilant (TRELEGY ELLIPTA) 100-62.5-25 MCG/INH AEPB Inhale 1 puff into the lungs daily.     furosemide (LASIX) 20 MG tablet Take 1 tablet (20 mg total) by mouth daily as needed for fluid or edema. 45 tablet 3   ipratropium-albuterol (DUONEB) 0.5-2.5 (3) MG/3ML SOLN Take 3 mLs by nebulization 4 (four) times daily as needed for shortness of breath.     metoprolol succinate (TOPROL-XL) 50 MG 24 hr tablet Take 1 tablet (50 mg total) by mouth daily. Take with or immediately following a meal. 90 tablet 3   nitroGLYCERIN (NITROSTAT) 0.4 MG SL tablet Place 1 tablet under the tongue every 5 (five) minutes x 3 doses as needed for chest pain.     ondansetron (ZOFRAN) 8 MG tablet Take 8 mg by mouth as needed for nausea.     oxyCODONE (OXY IR/ROXICODONE) 5 MG immediate release tablet Take 5 mg by mouth 4 (four) times daily as needed for severe pain.     OXYGEN Inhale into the lungs. 2 liters nasal canula contious     sacubitril-valsartan (ENTRESTO) 49-51 MG Take 1 tablet by mouth 2 (two) times daily. 180 tablet 3   spironolactone (ALDACTONE) 25 MG tablet Take 1 tablet (25 mg total) by mouth daily. 30 tablet 3   No current facility-administered medications for this visit.    Allergies:   Morphine, Hydrocodone-acetaminophen, Other, Chantix [varenicline], Ibuprofen, Imitrex [sumatriptan], Naprosyn [naproxen], Tylenol [acetaminophen], and Codeine   Social History:  The patient  reports that she has been smoking cigarettes. She has a 120.00 pack-year smoking history. She has never used smokeless tobacco. She reports that she does not currently use alcohol. She reports current drug use. Drug: Marijuana.   Family History:  The patient's family history includes Alcoholism in her brother; Diabetes in her brother, mother, and sister;  Failure to thrive in her brother; Heart attack in her sister; Heart attack (age of onset: 45) in her mother; Hypertension in her brother, mother, and sister; Other in her father; Stroke in her brother.    ROS:  Please see the history of present illness.   Otherwise, review of systems is positive for none.   All other systems are reviewed and negative.    PHYSICAL EXAM: VS:  BP 140/76   Pulse 92   Ht 5\' 1"  (1.549 m)   Wt 155 lb 9.6 oz (70.6 kg)   SpO2 94% Comment: with 2L of 02  BMI 29.40 kg/m  , BMI Body mass index is 29.4 kg/m. GEN: Well nourished, well developed, in no acute distress  HEENT: normal  Neck: no JVD, carotid bruits, or masses Cardiac: RRR; no murmurs, rubs, or gallops,no edema  Respiratory:  clear to auscultation bilaterally, normal work of breathing GI: soft, nontender, nondistended, + BS MS: no  deformity or atrophy  Skin: warm and dry Neuro:  Strength and sensation are intact Psych: euthymic mood, full affect  EKG:  EKG is ordered today. Personal review of the ekg ordered shows sinus rhythm, rate 92  Recent Labs: No results found for requested labs within last 8760 hours.    Lipid Panel  No results found for: CHOL, TRIG, HDL, CHOLHDL, VLDL, LDLCALC, LDLDIRECT   Wt Readings from Last 3 Encounters:  03/03/21 155 lb 9.6 oz (70.6 kg)  01/28/21 149 lb (67.6 kg)  12/16/20 153 lb (69.4 kg)      Other studies Reviewed: Additional studies/ records that were reviewed today include: TTE 01/02/21  Review of the above records today demonstrates:   1. Left ventricular ejection fraction, by estimation, is 30 to 35%. The  left ventricle has moderately decreased function. The left ventricle  demonstrates regional wall motion abnormalities (see scoring  diagram/findings for description). Left ventricular   diastolic parameters are consistent with Grade I diastolic dysfunction  (impaired relaxation).   2. Right ventricular systolic function is normal. The right  ventricular  size is normal. There is normal pulmonary artery systolic pressure.   3. The mitral valve is normal in structure. No evidence of mitral valve  regurgitation. No evidence of mitral stenosis.   4. The aortic valve is normal in structure. Aortic valve regurgitation is  trivial. No aortic stenosis is present.   5. The inferior vena cava is normal in size with greater than 50%  respiratory variability, suggesting right atrial pressure of 3 mmHg.   Cardiac monitor is 02/16/2021 personally reviewed There were 11 triggered and 8 diary events all sinus rhythm sinus tachycardia predominantly associated with PVCs. Ventricular ectopy was occasional 76 couplets 1 triplet and 3 episodes of nonsustained VT to the longest 37 complexes at rate of 150 bpm monomorphic. Supraventricular ectopy was rare and there were no episodes of atrial fibrillation or flutter  ASSESSMENT AND PLAN:  1.  Coronary artery disease: Status post inferior STEMI.  Stents placed to the RCA.  Continue Plavix 75 mg daily, aspirin 81 mg daily.  Chest pain has remained stable.  2.  Chronic systolic heart failure due to ischemic cardiomyopathy: Currently on Entresto 49-51 mg twice daily, Toprol-XL 25 mg daily.  We Sreeja Spies increase her Toprol-XL to 50 mg and add Aldactone 25 mg to improve her heart failure control.  Due to her systolic heart failure and nonsustained VT, she would benefit from ICD implant.  Risks and benefits were discussed with bleeding, tamponade, infection, pneumothorax, lead dislodgment, renal failure, death.  She understands these risks and has agreed to the procedure.  3.  Hyperlipidemia: Continue atorvastatin 80 mg per primary cardiology.  4.  Tobacco abuse: Patient continues to smoke despite her coronary artery disease and COPD.  We had a long discussion about complete cessation.  She Jiselle Sheu discuss this further with her pulmonologist at her next appointment later today.  Case discussed with primary  cardiology  Current medicines are reviewed at length with the patient today.   The patient does not have concerns regarding her medicines.  The following changes were made today:  none  Labs/ tests ordered today include:  Orders Placed This Encounter  Procedures   Basic metabolic panel   CBC   Basic metabolic panel   EKG [id number]     Disposition:   FU with Imari Sivertsen 3 months  Signed, Marcellene Shivley Meredith Leeds, MD  03/03/2021 10:22 AM     [facility] [id number] [place] [address] [phone] (office) [phone] (fax)

## 2021-03-03 NOTE — Patient Instructions (Addendum)
Medication Instructions:  Your physician has recommended you make the following change in your medication:  INCREASE Toprol to 50 mg once daily START Aldactone 25 mg once daily     * If you need a refill on your cardiac medications before your next appointment, please call your pharmacy. *   Labwork: Pre procedure lab work today: BMET & CBC w/ diff  Your physician recommends that you return for follow up BMET on: 03/11/2021.  You can stop by the [facility] office, you do NOT need to be fasting.  If you have labs (blood work) drawn today and your tests are completely normal, you will receive your results only by: [place] (if you have MyChart) OR A paper copy in the mail If you have any lab test that is abnormal or we need to change your treatment, we will call you to review the results.   Testing/Procedures: Your physician has recommended that you have a defibrillator inserted. An implantable cardioverter defibrillator (ICD) is a small device that is placed in your chest or, in rare cases, your abdomen. This device uses electrical pulses or shocks to help control life-threatening, irregular heartbeats that could lead the heart to suddenly stop beating (sudden cardiac arrest). Leads are attached to the ICD that goes into your heart. This is done in the hospital and usually requires an overnight stay. Please follow the instructions below, located under the special instructions section.   Follow-Up: Your physician recommends that you schedule a wound check appointment 10-14 days, after your procedure on 03/14/2021, with the device clinic.  Your physician recommends that you schedule a follow up appointment in 91 days, after your procedure on 03/14/2021, with Dr. Curt Bears.   Thank you for choosing [facility]!!   Casey Curet, RN [phone]   Any Other Special Instructions Will Be Listed Below (If Applicable).     Implantable Device Instructions    Implantable Device  Instructions  You are scheduled for: Implantable cardiac defibrillator on 03/14/2021 with Dr. Curt Bears.  1.   Pre procedure testing-             A.  LAB WORK--- On 03/03/2021  for your pre procedure blood work.  You do NOT need to be fasting.  On the day of your procedure 03/14/2021 you will go to [facility] [phone]) at 11:30 am.  Dennis Bast will go to the main entrance [address] [employer]) and enter where the [employer] are.  You will check in at ADMITTING.  You may have one support person come in to the hospital with you.  They will be asked to wait in the waiting room.   3.   Do not eat or drink after midnight prior to your procedure.   4.   On the morning of your procedure do NOT take any medication.  5.  The night before your procedure and the morning of your procedure scrub your neck/chest with surgical scrub.  See instruction letter.   5.  Plan for an overnight stay, but you may be discharged home after your procedure. If you use your phone frequently bring your phone charger, in case you have to stay.  If you are discharged after your procedure you will need someone to drive you home and be with your for 24 hours after your procedure.   6.  You will follow up with the [place] clinic 10-14 days after your procedure. You will follow up with Dr. Curt Bears  91 days after your procedure.  These appointments will be made for you.  7. FYI: For your safety, and to allow [place] to monitor your vital signs accurately during the surgery/procedure we request that if you have artificial nails, gel coating, SNS etc. Please have those removed prior to your surgery/procedure. Not having the nail coverings /polish removed may result in cancellation or delay of your surgery/procedure.   * If you have ANY questions after you get home, please call the office [phone] and ask for Grizel Vesely RN or send a MyChart message.   [place] - Preparing For Surgery  Before surgery, you can  play an important role. Because skin is not sterile, your skin needs to be as free of germs as possible. You can reduce the number of germs on your skin by washing with CHG (chlorahexidine gluconate) Soap before surgery.  CHG is an antiseptic cleaner which kills germs and bonds with the skin to continue killing germs even after washing.   Please do not use if you have an allergy to CHG or antibacterial soaps.  If your skin becomes reddened/irritated stop using the CHG.   Do not shave (including legs and underarms) for at least 48 hours prior to first CHG shower.  It is OK to shave your face.  Please follow these instructions carefully:  1.  Shower the night before surgery and the morning of surgery with CHG.  2.  If you choose to wash your hair, wash your hair first as usual with your normal shampoo.  3.  After you shampoo, rinse your hair and body thoroughly to remove the shampoo.  4.  Use CHG as you would any other liquid soap.  You can apply CHG directly to the skin and wash gently with a clean washcloth. 5.  Apply the CHG Soap to your body ONLY FROM THE NECK DOWN.  Do not use on open wounds or open sores.  Avoid contact with your eyes, ears, mouth and genitals (private parts).  Wash genitals (private parts) with your normal soap.  6.  Wash thoroughly, paying special attention to the area where your surgery will be performed.  7.  Thoroughly rinse your body with warm water from the neck down.   8.  DO NOT shower/wash with your normal soap after using and rinsing off the CHG soap.  9.  Pat yourself dry with a clean towel.           10.  Wear clean pajamas.           11.  Place clean sheets on your bed the night of your first shower and do not sleep with pets.  Day of Surgery: Do not apply any deodorants/lotions.  Please wear clean clothes to the hospital/surgery center.     Cardioverter Defibrillator Implantation An implantable cardioverter defibrillator (ICD) is a small, lightweight,  battery-powered device that is placed (implanted) under the skin in the chest or abdomen. Your caregiver may prescribe an ICD if: You have had an irregular heart rhythm (arrhythmia) that originated in the lower chambers of the heart (ventricles). Your heart has been damaged by a disease (such as coronary artery disease) or heart condition (such as a heart attack). An ICD consists of a battery that lasts several years, a small computer called a pulse generator, and wires called leads that go into the heart. It is used to detect and correct two dangerous arrhythmias: a rapid heart rhythm (tachycardia) and an arrhythmia in which the  ventricles contract in an uncoordinated way (fibrillation). When an ICD detects tachycardia, it sends an electrical signal to the heart that restores the heartbeat to normal (cardioversion). This signal is usually painless. If cardioversion does not work or if the ICD detects fibrillation, it delivers a small electrical shock to the heart (defibrillation) to restart the heart. The shock may feel like a strong jolt in the chest. ICDs may be programmed to correct other problems. Sometimes, ICDs are programmed to act as another type of implantable device called a pacemaker. Pacemakers are used to treat a slow heartbeat (bradycardia). LET YOUR CAREGIVER KNOW ABOUT: Any allergies you have. All medicines you are taking, including vitamins, herbs, eyedrops, and over-the-counter medicines and creams. Previous problems you or members of your family have had with the use of anesthetics. Any blood disorders you have had. Other health problems you have. RISKS AND COMPLICATIONS Generally, the procedure to implant an ICD is safe. However, as with any surgical procedure, complications can occur. Possible complications associated with implanting an ICD include: Swelling, bleeding, or bruising at the site where the ICD was implanted. Infection at the site where the ICD was implanted. A  reaction to medicine used during the procedure. Nerve, heart, or blood vessel damage. Blood clots. BEFORE THE PROCEDURE You may need to have blood tests, heart tests, or a chest X-ray done before the day of the procedure. Ask your caregiver about changing or stopping your regular medicines. Make plans to have someone drive you home. You may need to stay in the hospital overnight after the procedure. Stop smoking at least 24 hours before the procedure. Take a bath or shower the night before the procedure. You may need to scrub your chest or abdomen with a special type of soap. Do not eat or drink before your procedure for as long as directed by your caregiver. Ask if it is okay to take any needed medicine with a small sip of water. PROCEDURE  The procedure to implant an ICD in your chest or abdomen is usually done at a hospital in a room that has a large X-ray machine called a fluoroscope. The machine will be above you during the procedure. It will help your caregiver see your heart during the procedure. Implanting an ICD usually takes 1-3 hours. Before the procedure:  Small monitors will be put on your body. They will be used to check your heart, blood pressure, and oxygen level. A needle will be put into a vein in your hand or arm. This is called an intravenous (IV) access tube. Fluids and medicine will flow directly into your body through the IV tube. Your chest or abdomen will be cleaned with a germ-killing (antiseptic) solution. The area may be shaved. You may be given medicine to help you relax (sedative). You will be given a medicine called a local anesthetic. This medicine will make the surgical site numb while the ICD is implanted. You will be sleepy but awake during the procedure. After you are numb the procedure will begin. The caregiver will: Make a small cut (incision). This will make a pocket deep under your skin that will hold the pulse generator. Guide the leads through a large  blood vessel into your heart and attach them to the heart muscles. Depending on the ICD, the leads may go into one ventricle or they may go to both ventricles and into an upper chamber of the heart (atrium). Test the ICD. Close the incision with stitches, glue, or staples. AFTER  THE PROCEDURE You may feel pain. Some pain is normal. It may last a few days. You may stay in a recovery area until the local anesthetic has worn off. Your blood pressure and pulse will be checked often. You will be taken to a room where your heart will be monitored. A chest X-ray will be taken. This is done to check that the cardioverter defibrillator is in the right place. You may stay in the hospital overnight. A slight bump may be seen over the skin where the ICD was placed. Sometimes, it is possible to feel the ICD under the skin. This is normal. In the months and years afterward, your caregiver will check the device, the leads, and the battery every few months. Eventually, when the battery is low, the ICD will be replaced.   This information is not intended to replace advice given to you by your health care provider. Make sure you discuss any questions you have with your health care provider.   Document Released: 02/14/2002 Document Revised: 03/15/2013 Document Reviewed: 06/13/2012 Elsevier Interactive Patient Education 2016 [place] Defibrillator Implantation, Care After This sheet gives you information about how to care for yourself after your procedure. Your health care provider may also give you more specific instructions. If you have problems or questions, contact your health care provider. What can I expect after the procedure? After the procedure, it is common to have: Some pain. It may last a few days. A slight bump over the skin where the device was placed. Sometimes, it is possible to feel the device under the skin. This is normal.  During the months and years after your  procedure, your health care provider will check the device, the leads, and the battery every few months. Eventually, when the battery is low, the device will be replaced. Follow these instructions at home: Medicines Take over-the-counter and prescription medicines only as told by your health care provider. If you were prescribed an antibiotic medicine, take it as told by your health care provider. Do not stop taking the antibiotic even if you start to feel better. Incision care  Follow instructions from your health care provider about how to take care of your incision area. Make sure you: Wash your hands with soap and water before you change your bandage (dressing). If soap and water are not available, use hand sanitizer. Change your dressing as told by your health care provider. Leave stitches (sutures), skin glue, or adhesive strips in place. These skin closures may need to stay in place for 2 weeks or longer. If adhesive strip edges start to loosen and curl up, you may trim the loose edges. Do not remove adhesive strips completely unless your health care provider tells you to do that. Check your incision area every day for signs of infection. Check for: More redness, swelling, or pain. More fluid or blood. Warmth. Pus or a bad smell. Do not use lotions or ointments near the incision area unless told by your health care provider. Keep the incision area clean and dry for 2-3 days after the procedure or for as long as told by your health care provider. It takes several weeks for the incision site to heal completely. Do not take baths, swim, or use a hot tub until your health care provider approves. Activity Try to walk a little every day. Exercising is important after this procedure. Also, use your shoulder on the side of the defibrillator in daily tasks that do not  require a lot of motion. For at least 6 weeks: Do not lift your upper arm above your shoulders. This means no tennis, golf, or  swimming for this period of time. If you tend to sleep with your arm above your head, use a restraint to prevent this during sleep. Avoid sudden jerking, pulling, or chopping movements that pull your upper arm far away from your body. Ask your health care provider when you may go back to work. Check with your health care provider before you start to drive or play sports. Electric and magnetic fields Tell all health care providers that you have a defibrillator. This may prevent them from giving you an MRI scan because strong magnets are used for that test. If you must pass through a metal detector, quickly walk through it. Do not stop under the detector, and do not stand near it. Avoid places or objects that have a strong electric or magnetic field, including: Engineer, maintenance. At the airport, let officials know that you have a defibrillator. Your defibrillator ID card will let you be checked in a way that is safe for you and will not damage your defibrillator. Also, do not let a security person wave a magnetic wand near your defibrillator. That can make it stop working. Power plants. Large electrical generators. Anti-theft systems or electronic article surveillance (EAS). Radiofrequency transmission towers, such as cell phone and radio towers. Do not use amateur (ham) radio equipment or electric (arc) welding torches. Some devices are safe to use if held at least 12 inches (30 cm) from your defibrillator. These include power tools, lawn mowers, and speakers. If you are unsure if something is safe to use, ask your health care provider. Do not use MP3 player headphones. They have magnets. You may safely use electric blankets, heating pads, computers, and microwave ovens. When using your cell phone, hold it to the ear that is on the opposite side from the defibrillator. Do not leave your cell phone in a pocket over the defibrillator. General instructions Follow diet instructions from your health  care provider, if this applies. Always keep your defibrillator ID card with you. The card should list the implant date, device model, and manufacturer. Consider wearing a medical alert bracelet or necklace. Have your defibrillator checked every 3-6 months or as often as told by your health care provider. Most defibrillators last for 4-8 years. Keep all follow-up visits as told by your health care provider. This is important for your health care provider to make sure your chest is healing the way it should. Ask your health care provider when you should come back to have your stitches or staples taken out. Contact a health care provider if: You feel one shock in your chest. You gain weight suddenly. Your legs or feet swell more than they have before. It feels like your heart is fluttering or skipping beats (heart palpitations). You have more redness, swelling, or pain around your incision. You have more fluid or blood coming from your incision. Your incision feels warm to the touch. You have pus or a bad smell coming from your incision. You have a fever. Get help right away if: You have chest pain. You feel more than one shock. You feel more short of breath than you have felt before. You feel more light-headed than you have felt before. Your incision starts to open up. This information is not intended to replace advice given to you by your health care provider. Make sure you discuss  any questions you have with your health care provider. Document Released: 12/12/2004 Document Revised: 12/13/2015 Document Reviewed: 10/30/2015 Elsevier Interactive Patient Education  2018 [place] Discharge Instructions for  Pacemaker/Defibrillator Patients  ACTIVITY No heavy lifting or vigorous activity with your left/right arm for 6 to 8 weeks.  Do not raise your left/right arm above your head for one week.  Gradually raise your affected arm as drawn below.           __  NO  DRIVING for     ; you may begin driving on     .  WOUND CARE Keep the wound area clean and dry.  Do not get this area wet for one week. No showers for one week; you may shower on     . The tape/steri-strips on your wound will fall off; do not pull them off.  No bandage is needed on the site.  DO  NOT apply any creams, oils, or ointments to the wound area. If you notice any drainage or discharge from the wound, any swelling or bruising at the site, or you develop a fever > 101? F after you are discharged home, call the office at once.  SPECIAL INSTRUCTIONS You are still able to use cellular telephones; use the ear opposite the side where you have your pacemaker/defibrillator.  Avoid carrying your cellular phone near your device. When traveling through airports, show security personnel your identification card to avoid being screened in the metal detectors.  Ask the security personnel to use the hand wand. Avoid arc welding equipment, MRI testing (magnetic resonance imaging), TENS units (transcutaneous nerve stimulators).  Call the office for questions about other devices. Avoid electrical appliances that are in poor condition or are not properly grounded. Microwave ovens are safe to be near or to operate.  ADDITIONAL INFORMATION FOR DEFIBRILLATOR PATIENTS SHOULD YOUR DEVICE GO OFF: If your device goes off ONCE and you feel fine afterward, notify the device clinic nurses. If your device goes off ONCE and you do not feel well afterward, call 911. If your device goes off TWICE, call 911. If your device goes off THREE TIMES IN ONE DAY, call 911.  DO NOT DRIVE YOURSELF OR A FAMILY MEMBER WITH A DEFIBRILLATOR TO THE HOSPITAL--CALL 911.      Spironolactone Tablets What is this medication? SPIRONOLACTONE (speer on oh LAK tone) treats high blood pressure and heart failure. It may also be used to reduce swelling related to heart, kidney, or liver disease. It helps your kidneys remove more fluid and  salt from your blood through the urine without losing too much potassium. It belongs to a group of medications called diuretics. This medicine may be used for other purposes; ask your health care provider or pharmacist if you have questions. COMMON BRAND NAME(S): Aldactone What should I tell my care team before I take this medication? They need to know if you have any of these conditions: Addison's disease or low adrenal gland function High blood level of potassium Kidney disease Liver disease An unusual or allergic reaction to spironolactone, other medications, foods, dyes, or preservatives Pregnant or trying to get pregnant Breast-feeding How should I use this medication? Take this medication by mouth. Take it as directed on the prescription label at the same time every day. You can take it with or without food. You should always take it the same way. Keep taking it unless your care team tells you to stop. Talk to your  care team about the use of this medication in children. Special care may be needed. Overdosage: If you think you have taken too much of this medicine contact a poison control center or emergency room at once. NOTE: This medicine is only for you. Do not share this medicine with others. What if I miss a dose? If you miss a dose, take it as soon as you can. If it is almost time for your next dose, take only that dose. Do not take double or extra doses. What may interact with this medication? Do not take this medication with any of the following: Cidofovir Eplerenone Tranylcypromine This medication may also interact with the following: Aspirin Certain medications for blood pressure or heart disease like benazepril, lisinopril, losartan, valsartan Certain medications that treat or prevent blood clots like heparin and enoxaparin Cholestyramine Cyclosporine Digoxin Lithium Medications that relax muscles for surgery NSAIDs, medications for pain and inflammation, like  ibuprofen or naproxen Other diuretics Potassium salts or supplements Steroid medications like prednisone or cortisone Trimethoprim This list may not describe all possible interactions. Give your health care provider a list of all the medicines, herbs, non-prescription drugs, or dietary supplements you use. Also tell them if you smoke, drink alcohol, or use illegal drugs. Some items may interact with your medicine. What should I watch for while using this medication? Visit your care team for regular checks on your progress. Check your blood pressure as directed. Ask your care team what your blood pressure should be. Also, find out when you should contact him or her. Do not treat yourself for coughs, colds, or pain while you are using this medication without asking your care team for advice. Some medications may increase your blood pressure. Check with your care team if you have severe diarrhea, nausea, and vomiting, or if you sweat a lot. The loss of too much body fluid may make it dangerous for you to take this medication. You may need to be on a special diet while taking this medication. Ask your care team. Also, find out how many glasses of fluid you need to drink each day. You may get drowsy or dizzy. Do not drive, use machinery, or do anything that needs mental alertness until you know how this medication affects you. Do not stand or sit up quickly, especially if you are an older patient. This reduces the risk of dizzy or fainting spells. Alcohol may interfere with the effects of this medication. Avoid alcoholic drinks. Avoid salt substitutes unless you are told otherwise by your care team. What side effects may I notice from receiving this medication? Side effects that you should report to your care team as soon as possible: Allergic reactions-skin rash, itching, hives, swelling of the face, lips, tongue, or throat Dehydration-increased thirst, dry mouth, feeling faint or lightheaded, headache,  dark yellow or brown urine High potassium level-muscle weakness, fast or irregular heartbeat Kidney injury-decrease in the amount of urine, swelling of the ankles, hands, or feet Low blood pressure-dizziness, feeling faint or lightheaded, blurry vision Low sodium level-muscle weakness, fatigue, dizziness, headache, confusion Side effects that usually do not require medical attention (report to your care team if they continue or are bothersome): Breast pain or tenderness Changes in sex drive or performance Dizziness Headache Irregular menstrual cycles or spotting Unexpected breast tissue growth This list may not describe all possible side effects. Call your doctor for medical advice about side effects. You may report side effects to FDA at 1-800-FDA-1088. Where should I keep my  medication? Keep out of the reach of children and pets. Store below 25 degrees C (77 degrees F). Get rid of any unused medication after the expiration date. To get rid of medications that are no longer needed or have expired: Take the medication to a medication take-back program. Check with your pharmacy or law enforcement to find a location. If you cannot return the medication, check the label or package insert to see if the medication should be thrown out in the garbage or flushed down the toilet. If you are not sure, ask your care team. If it is safe to put into the trash, take the medication out of the container. Mix the medication with cat litter, dirt, coffee grounds, or other unwanted substance. Seal the mixture in a bag or container. Put it in the trash. NOTE: This sheet is a summary. It may not cover all possible information. If you have questions about this medicine, talk to your doctor, pharmacist, or health care provider.  2022 Elsevier/Gold Standard (2020-08-22 13:01:04)

## 2021-03-04 LAB — CBC
Hematocrit: 45.5 % (ref 34.0–46.6)
Hemoglobin: 15.6 g/dL (ref 11.1–15.9)
MCH: 29.5 pg (ref 26.6–33.0)
MCHC: 34.3 g/dL (ref 31.5–35.7)
MCV: 86 fL (ref 79–97)
Platelets: 264 10*3/uL (ref 150–450)
RBC: 5.29 x10E6/uL — ABNORMAL HIGH (ref 3.77–5.28)
RDW: 12.5 % (ref 11.7–15.4)
WBC: 9.2 10*3/uL (ref 3.4–10.8)

## 2021-03-04 LAB — BASIC METABOLIC PANEL
BUN/Creatinine Ratio: 26 (ref 12–28)
BUN: 14 mg/dL (ref 8–27)
CO2: 26 mmol/L (ref 20–29)
Calcium: 9.5 mg/dL (ref 8.7–10.3)
Chloride: 98 mmol/L (ref 96–106)
Creatinine, Ser: 0.54 mg/dL — ABNORMAL LOW (ref 0.57–1.00)
Glucose: 181 mg/dL — ABNORMAL HIGH (ref 70–99)
Potassium: 5.1 mmol/L (ref 3.5–5.2)
Sodium: 140 mmol/L (ref 134–144)
eGFR: 101 mL/min/{1.73_m2} (ref >59)

## 2021-03-13 NOTE — Pre-Procedure Instructions (Signed)
Instructed patient on the following items: Arrival time 1130  Nothing to eat or drink after midnight No meds AM of procedure Responsible person to drive you home and stay with you for 24 hrs Wash with special soap night before and morning of procedure If on anti-coagulant drug instructions Plavix- hold todays dose

## 2021-03-14 ENCOUNTER — Encounter (HOSPITAL_COMMUNITY)
Admission: RE | Disposition: A | Discharge status: Home / Self Care | Payer: Self-pay | Source: Home / Self Care | Attending: Cardiology

## 2021-03-14 ENCOUNTER — Ambulatory Visit (HOSPITAL_COMMUNITY)
Admission: RE | Admit: 2021-03-14 | Discharge: 2021-03-14 | Disposition: A | Discharge status: Home / Self Care | Payer: Medicare HMO | Attending: Cardiology | Admitting: Cardiology

## 2021-03-14 ENCOUNTER — Other Ambulatory Visit: Payer: Self-pay

## 2021-03-14 ENCOUNTER — Ambulatory Visit (HOSPITAL_COMMUNITY): Payer: Medicare HMO

## 2021-03-14 DIAGNOSIS — I428 Other cardiomyopathies: Secondary | ICD-10-CM

## 2021-03-14 DIAGNOSIS — I255 Ischemic cardiomyopathy: Secondary | ICD-10-CM | POA: Diagnosis present

## 2021-03-14 DIAGNOSIS — Z9981 Dependence on supplemental oxygen: Secondary | ICD-10-CM | POA: Diagnosis not present

## 2021-03-14 DIAGNOSIS — Z886 Allergy status to analgesic agent status: Secondary | ICD-10-CM | POA: Insufficient documentation

## 2021-03-14 DIAGNOSIS — Z833 Family history of diabetes mellitus: Secondary | ICD-10-CM | POA: Insufficient documentation

## 2021-03-14 DIAGNOSIS — Z7902 Long term (current) use of antithrombotics/antiplatelets: Secondary | ICD-10-CM | POA: Diagnosis not present

## 2021-03-14 DIAGNOSIS — Z885 Allergy status to narcotic agent status: Secondary | ICD-10-CM | POA: Diagnosis not present

## 2021-03-14 DIAGNOSIS — F17200 Nicotine dependence, unspecified, uncomplicated: Secondary | ICD-10-CM | POA: Diagnosis not present

## 2021-03-14 DIAGNOSIS — E7800 Pure hypercholesterolemia, unspecified: Secondary | ICD-10-CM | POA: Diagnosis not present

## 2021-03-14 DIAGNOSIS — I252 Old myocardial infarction: Secondary | ICD-10-CM | POA: Diagnosis not present

## 2021-03-14 DIAGNOSIS — Z955 Presence of coronary angioplasty implant and graft: Secondary | ICD-10-CM | POA: Insufficient documentation

## 2021-03-14 DIAGNOSIS — Z7982 Long term (current) use of aspirin: Secondary | ICD-10-CM | POA: Insufficient documentation

## 2021-03-14 DIAGNOSIS — E1151 Type 2 diabetes mellitus with diabetic peripheral angiopathy without gangrene: Secondary | ICD-10-CM | POA: Insufficient documentation

## 2021-03-14 DIAGNOSIS — Z8249 Family history of ischemic heart disease and other diseases of the circulatory system: Secondary | ICD-10-CM | POA: Insufficient documentation

## 2021-03-14 DIAGNOSIS — I5022 Chronic systolic (congestive) heart failure: Secondary | ICD-10-CM | POA: Diagnosis not present

## 2021-03-14 DIAGNOSIS — I2510 Atherosclerotic heart disease of native coronary artery without angina pectoris: Secondary | ICD-10-CM | POA: Insufficient documentation

## 2021-03-14 DIAGNOSIS — J449 Chronic obstructive pulmonary disease, unspecified: Secondary | ICD-10-CM | POA: Diagnosis not present

## 2021-03-14 DIAGNOSIS — I110 Hypertensive heart disease with heart failure: Secondary | ICD-10-CM | POA: Insufficient documentation

## 2021-03-14 DIAGNOSIS — F1290 Cannabis use, unspecified, uncomplicated: Secondary | ICD-10-CM | POA: Insufficient documentation

## 2021-03-14 DIAGNOSIS — Z9049 Acquired absence of other specified parts of digestive tract: Secondary | ICD-10-CM | POA: Diagnosis not present

## 2021-03-14 DIAGNOSIS — E1142 Type 2 diabetes mellitus with diabetic polyneuropathy: Secondary | ICD-10-CM | POA: Diagnosis not present

## 2021-03-14 DIAGNOSIS — Z79899 Other long term (current) drug therapy: Secondary | ICD-10-CM | POA: Insufficient documentation

## 2021-03-14 DIAGNOSIS — Z8673 Personal history of transient ischemic attack (TIA), and cerebral infarction without residual deficits: Secondary | ICD-10-CM | POA: Insufficient documentation

## 2021-03-14 DIAGNOSIS — Z95818 Presence of other cardiac implants and grafts: Secondary | ICD-10-CM

## 2021-03-14 HISTORY — PX: ICD IMPLANT: EP1208

## 2021-03-14 LAB — GLUCOSE, CAPILLARY: Glucose-Capillary: 161 mg/dL — ABNORMAL HIGH (ref 70–99)

## 2021-03-14 SURGERY — ICD IMPLANT

## 2021-03-14 MED ORDER — MIDAZOLAM HCL 5 MG/5ML IJ SOLN
INTRAMUSCULAR | Status: AC
  Filled 2021-03-14: qty 5

## 2021-03-14 MED ORDER — HEPARIN (PORCINE) IN NACL 1000-0.9 UT/500ML-% IV SOLN
INTRAVENOUS | Status: AC
  Filled 2021-03-14: qty 500

## 2021-03-14 MED ORDER — FENTANYL CITRATE (PF) 100 MCG/2ML IJ SOLN
INTRAMUSCULAR | Status: DC | PRN
  Administered 2021-03-14 (×2): 25 ug via INTRAVENOUS

## 2021-03-14 MED ORDER — MIDAZOLAM HCL 5 MG/5ML IJ SOLN
INTRAMUSCULAR | Status: DC | PRN
  Administered 2021-03-14 (×2): 1 mg via INTRAVENOUS

## 2021-03-14 MED ORDER — CEFAZOLIN SODIUM-DEXTROSE 2-4 GM/100ML-% IV SOLN
2.0000 g | INTRAVENOUS | Status: AC
  Administered 2021-03-14: 2 g via INTRAVENOUS

## 2021-03-14 MED ORDER — CHLORHEXIDINE GLUCONATE 4 % EX LIQD
4.0000 "application " | Freq: Once | CUTANEOUS | Status: DC
  Filled 2021-03-14: qty 60

## 2021-03-14 MED ORDER — ONDANSETRON HCL 4 MG/2ML IJ SOLN
4.0000 mg | Freq: Four times a day (QID) | INTRAMUSCULAR | Status: DC | PRN
  Administered 2021-03-14: 4 mg via INTRAVENOUS
  Filled 2021-03-14: qty 2

## 2021-03-14 MED ORDER — CEFAZOLIN SODIUM-DEXTROSE 1-4 GM/50ML-% IV SOLN
1.0000 g | Freq: Four times a day (QID) | INTRAVENOUS | Status: DC
Start: 2021-03-14 — End: 2021-03-15
  Administered 2021-03-14: 1 g via INTRAVENOUS
  Filled 2021-03-14: qty 50

## 2021-03-14 MED ORDER — HEPARIN (PORCINE) IN NACL 1000-0.9 UT/500ML-% IV SOLN
INTRAVENOUS | Status: DC | PRN
  Administered 2021-03-14: 500 mL

## 2021-03-14 MED ORDER — LIDOCAINE HCL (PF) 1 % IJ SOLN
INTRAMUSCULAR | Status: DC | PRN
  Administered 2021-03-14: 45 mL

## 2021-03-14 MED ORDER — LIDOCAINE HCL (PF) 1 % IJ SOLN
INTRAMUSCULAR | Status: AC
  Filled 2021-03-14: qty 90

## 2021-03-14 MED ORDER — SODIUM CHLORIDE 0.9 % IV SOLN
INTRAVENOUS | Status: AC
  Filled 2021-03-14: qty 2

## 2021-03-14 MED ORDER — SODIUM CHLORIDE 0.9 % IV SOLN: INTRAVENOUS | Status: DC

## 2021-03-14 MED ORDER — FENTANYL CITRATE (PF) 100 MCG/2ML IJ SOLN
INTRAMUSCULAR | Status: AC
  Filled 2021-03-14: qty 2

## 2021-03-14 MED ORDER — ALBUTEROL SULFATE (2.5 MG/3ML) 0.083% IN NEBU
2.5000 mg | INHALATION_SOLUTION | Freq: Once | RESPIRATORY_TRACT | Status: AC
  Administered 2021-03-14: 2.5 mg via RESPIRATORY_TRACT
  Filled 2021-03-14: qty 3

## 2021-03-14 MED ORDER — CEFAZOLIN SODIUM-DEXTROSE 2-4 GM/100ML-% IV SOLN
INTRAVENOUS | Status: AC
  Filled 2021-03-14: qty 100

## 2021-03-14 MED ORDER — SODIUM CHLORIDE 0.9 % IV SOLN
80.0000 mg | INTRAVENOUS | Status: AC
  Administered 2021-03-14: 80 mg

## 2021-03-14 SURGICAL SUPPLY — 7 items
CABLE SURGICAL S-101-97-12 (CABLE) ×3 IMPLANT
ICD VISIA MRI VR DVFB1D4 (ICD Generator) IMPLANT
LEAD SPRINT QUAT SEC 6935M-62 (Lead) ×1 IMPLANT
PAD PRO RADIOLUCENT 2001M-C (PAD) ×3 IMPLANT
SHEATH 9FR PRELUDE SNAP 13 (SHEATH) ×1 IMPLANT
TRAY PACEMAKER INSERTION (PACKS) ×3 IMPLANT
VISIA MRI VR DVFB1D4 (ICD Generator) ×2 IMPLANT

## 2021-03-14 NOTE — Interval H&P Note (Signed)
History and Physical Interval Note:  03/14/2021 10:54 AM  Casey Ramos  has presented today for surgery, with the diagnosis of cardiomyopathy.  The various methods of treatment have been discussed with the patient and family. After consideration of risks, benefits and other options for treatment, the patient has consented to  Procedure(s): ICD IMPLANT (N/A) as a surgical intervention.  The patient's history has been reviewed, patient examined, no change in status, stable for surgery.  I have reviewed the patient's chart and labs.  Questions were answered to the patient's satisfaction.     [facility]  ICD Criteria  Current LVEF:33%. Within 12 months prior to implant: Yes   Heart failure history: Yes, Class II  Cardiomyopathy history: Yes, Ischemic Cardiomyopathy - Prior MI.  Atrial Fibrillation/Atrial Flutter: No.  Ventricular tachycardia history: No.  Cardiac arrest history: No.  History of syndromes with risk of sudden death: No.  Previous ICD: No.  Current ICD indication: Primary  PPM indication: No.  Class I or II Bradycardia indication present: No  Beta Blocker therapy for 3 or more months: Yes, prescribed.   Ace Inhibitor/ARB therapy for 3 or more months: Yes, prescribed.    I have seen Casey Ramos is a 65 y.o. femalepre-procedural and has been referred by [facility] for consideration of ICD implant for primary prevention of sudden death.  The patient's chart has been reviewed and they meet criteria for ICD implant.  I have had a thorough discussion with the patient reviewing options.  The patient and their family (if available) have had opportunities to ask questions and have them answered. The patient and I have decided together through the [employer] to implant ICD at this time.  Risks, benefits, alternatives to ICD implantation were discussed in detail with the patient today. The patient  understands that the risks  include but are not limited to bleeding, infection, pneumothorax, perforation, tamponade, vascular damage, renal failure, MI, stroke, death, inappropriate shocks, and lead dislodgement and  wishes to proceed.

## 2021-03-17 ENCOUNTER — Encounter (HOSPITAL_COMMUNITY): Payer: Self-pay | Admitting: Cardiology

## 2021-03-17 ENCOUNTER — Telehealth: Payer: Self-pay

## 2021-03-17 NOTE — Telephone Encounter (Signed)
-----   Message from Shirley Friar, PA-C sent at 03/14/2021  4:21 PM EDT ----- Same day ICD discharge Dr. Curt Bears 10/7

## 2021-03-17 NOTE — Telephone Encounter (Signed)
Follow-up after same day discharge: Implant date: 03/14/21 MD: Allegra Lai, MD Device: Single Chamber Medtronic Visia AF MRI SureScan ICD (serial  Number [id number])  Location: Left Chest   Wound check visit: 03/24/21 [place] [facility] secondary to transportation barrier 90 day MD follow-up: 06/16/21  Remote Transmission received: Yes  Dressing removed: yes  Successful telephone encounter to patient to follow up post ICD implant. Patient c/o site pain 7/10. Patient states she is allergic to all OTC medications including tylenol which "gives me a bad rash". States she typically takes ASA or oxycodone for pain. Informed patient that oxy is typically not prescribed for device implant and to utilize ice for discomfort. This was confirmed by Dr. Curt Bears. Patient denies swelling or bleeding at site. States she was instructed to leave sling on until follow up. Informed patient sling was to be removed post op day one along with outer dressing. Patient removed sling during phone call. She is instructed to continue arm movement restrictions, showering restriction, and refrain from driving until follow up. Above appointments confirmed. Patient is provided device clinic contact for any additional questions or concerns.

## 2021-03-21 ENCOUNTER — Ambulatory Visit (INDEPENDENT_AMBULATORY_CARE_PROVIDER_SITE_OTHER): Payer: Medicare HMO | Admitting: Cardiology

## 2021-03-21 ENCOUNTER — Other Ambulatory Visit: Payer: Self-pay

## 2021-03-21 ENCOUNTER — Encounter: Payer: Self-pay | Admitting: Cardiology

## 2021-03-21 VITALS — BP 150/82 | HR 92 | Ht 61.0 in | Wt 158.0 lb

## 2021-03-21 DIAGNOSIS — J439 Emphysema, unspecified: Secondary | ICD-10-CM

## 2021-03-21 DIAGNOSIS — I255 Ischemic cardiomyopathy: Secondary | ICD-10-CM

## 2021-03-21 DIAGNOSIS — I25118 Atherosclerotic heart disease of native coronary artery with other forms of angina pectoris: Secondary | ICD-10-CM | POA: Diagnosis not present

## 2021-03-21 DIAGNOSIS — E7800 Pure hypercholesterolemia, unspecified: Secondary | ICD-10-CM

## 2021-03-21 DIAGNOSIS — I119 Hypertensive heart disease without heart failure: Secondary | ICD-10-CM | POA: Diagnosis not present

## 2021-03-21 LAB — COMPREHENSIVE METABOLIC PANEL
ALT: 20 IU/L (ref 0–32)
AST: 20 IU/L (ref 0–40)
Albumin/Globulin Ratio: 1.9 (ref 1.2–2.2)
Albumin: 4.5 g/dL (ref 3.8–4.8)
Alkaline Phosphatase: 88 IU/L (ref 44–121)
BUN/Creatinine Ratio: 21 (ref 12–28)
BUN: 15 mg/dL (ref 8–27)
Bilirubin Total: <0.2 mg/dL (ref 0.0–1.2)
CO2: 25 mmol/L (ref 20–29)
Calcium: 9.6 mg/dL (ref 8.7–10.3)
Chloride: 100 mmol/L (ref 96–106)
Creatinine, Ser: 0.7 mg/dL (ref 0.57–1.00)
Globulin, Total: 2.4 g/dL (ref 1.5–4.5)
Glucose: 224 mg/dL — ABNORMAL HIGH (ref 70–99)
Potassium: 4.8 mmol/L (ref 3.5–5.2)
Sodium: 140 mmol/L (ref 134–144)
Total Protein: 6.9 g/dL (ref 6.0–8.5)
eGFR: 95 mL/min/{1.73_m2} (ref >59)

## 2021-03-21 LAB — LIPID PANEL
Chol/HDL Ratio: 4 ratio (ref 0.0–4.4)
Cholesterol, Total: 271 mg/dL — ABNORMAL HIGH (ref 100–199)
HDL: 67 mg/dL (ref >39)
LDL Chol Calc (NIH): 184 mg/dL — ABNORMAL HIGH (ref 0–99)
Triglycerides: 115 mg/dL (ref 0–149)
VLDL Cholesterol Cal: 20 mg/dL (ref 5–40)

## 2021-03-21 MED ORDER — DAPAGLIFLOZIN PROPANEDIOL 10 MG PO TABS: 10.0000 mg | ORAL_TABLET | Freq: Every day | ORAL | 3 refills | Status: AC

## 2021-03-21 MED ORDER — SACUBITRIL-VALSARTAN 97-103 MG PO TABS: 1.0000 | ORAL_TABLET | Freq: Two times a day (BID) | ORAL | 3 refills | Status: DC

## 2021-03-21 NOTE — Patient Instructions (Signed)
Medication Instructions:  Your physician has recommended you make the following change in your medication:  START: Farxiga 10 mg take one tablet by mouth daily.  INCREASE: Entresto 97/103 mg take one tablet by mouth twice daily.  *If you need a refill on your cardiac medications before your next appointment, please call your pharmacy*   Lab Work: Your physician recommends that you return for lab work in: [place], Lipids If you have labs (blood work) drawn today and your tests are completely normal, you will receive your results only by: [place] (if you have MyChart) OR A paper copy in the mail If you have any lab test that is abnormal or we need to change your treatment, we will call you to review the results.   Testing/Procedures: None   Follow-Up: At [facility], you and your health needs are our priority.  As part of our continuing mission to provide you with exceptional heart care, we have created designated Provider Care Teams.  These Care Teams include your primary Cardiologist (physician) and Advanced Practice Providers (APPs -  Physician Assistants and Nurse Practitioners) who all work together to provide you with the care you need, when you need it.  We recommend signing up for the patient portal called "MyChart".  Sign up information is provided on this After Visit Summary.  MyChart is used to connect with patients for Virtual Visits (Telemedicine).  Patients are able to view lab/test results, encounter notes, upcoming appointments, etc.  Non-urgent messages can be sent to your provider as well.   To learn more about what you can do with MyChart, go to [email or web address].    Your next appointment:   3 month(s)  The format for your next appointment:   In Person  Provider:   Shirlee More, MD   Other Instructions

## 2021-03-21 NOTE — Progress Notes (Signed)
Cardiology Office Note:    Date:  03/21/2021   ID:  Casey Ramos, DOB Jan 10, 1956, MRN [id number]  PCP:  Aldona Bar, MD  Cardiologist:  Shirlee More, MD    Referring MD: Aldona Bar, MD    ASSESSMENT:    1. Coronary artery disease of native artery of native heart with stable angina pectoris (HCC)   2. Cardiomyopathy, ischemic   3. Hypertensive heart disease, unspecified whether heart failure present   4. Hypercholesterolemia   5. Pulmonary emphysema, unspecified emphysema type (HCC)    PLAN:    In order of problems listed above:  Stable CAD we will be sure that she is taking low dose aspirin and continuing dual antiplatelet therapy along with guideline directed therapy for severe LV dysfunction Uptitrate Entresto and SGLT2 inhibitor continue loop distal diuretic beta-blocker she now has an ICD Compensated no fluid overload Continue her high intensity statin check lipid profile Stable she will continue her current bronchodilators   Next appointment: 3 months   Medication Adjustments/Labs and Tests Ordered: Current medicines are reviewed at length with the patient today.  Concerns regarding medicines are outlined above.  Orders Placed This Encounter  Procedures   Comprehensive metabolic panel   Lipid panel   Meds ordered this encounter  Medications   sacubitril-valsartan (ENTRESTO) 97-103 MG    Sig: Take 1 tablet by mouth 2 (two) times daily.    Dispense:  180 tablet    Refill:  3   dapagliflozin propanediol (FARXIGA) 10 MG TABS tablet    Sig: Take 1 tablet (10 mg total) by mouth daily before breakfast.    Dispense:  90 tablet    Refill:  3    Chief Complaint  Patient presents with   Follow-up    History of Present Illness:    Casey Ramos is a 65 y.o. female with a hx of CAD cardiomyopathy ejection fraction 30 to 35% COPD obstructive sleep apnea and previous substance abuse last seen 12/16/2020 following admission [facility]  service 12/05/2020 with acute coronary syndrome ST elevation MI with PCI and stent to the mid and distal right coronary artery.She was admitted to [facility] service 12/05/2020-12/08/2020 with acute coronary syndrome ST elevation MI with PCI and stent to the proximal mid and distal right coronary artery.  She was received in transfer from [facility] where she initially had acute respiratory failure in the setting of Xanax overdose she was intubated transferred to [facility] hospital with inferior ST segment elevation MI.  Ejection fraction 30 to 35% with a dilated left ventricle and inferior lateral wall hypokinesia on echocardiogram.  Other problems included type 2 diabetes.  She was last seen by me 01/28/2021 with persistently reduced ejection fraction on guideline therapy was referred to EP for consideration of ICD.  ICD implant occurred 03/14/2020.  Compliance with diet, lifestyle and medications: Yes  Recent labs 12/08/2020 Creatinine 0.49 potassium 3.4 sodium 143 GFR greater than 90 cc Lipid profile 12/05/2020 cholesterol 217 LDL 142 triglycerides 162 HDL 43  She feels markedly improved more functional and now has a family member staying with her assisting her particularly with household activities She remains on continuous oxygen but is not short of breath indoors no edema orthopnea chest pain palpitations syncope Today we will uptitrate her to high-dose Entresto and add SGLT2 inhibitor recheck her renal function she was hypokalemic last assay recheck her lipid profile. She has follow-up Monday EP Past Medical History:  Diagnosis Date  Asthma    Bartholin's gland abscess 11/13/2020   Benign neoplasm of skin or subcutaneous tissue 02/26/2016   Chronic toe pain, right foot 04/17/2019   Formatting of this note might be different from the original. First and second toes right   Depression    Diabetes mellitus without complication ([place]) 12/12/2374   Dyslipidemia 02/12/2017    Essential hypertension 02/12/2017   GERD (gastroesophageal reflux disease) 11/13/2020   H/O: CVA (cerebrovascular accident) 02/12/2017   Hammer toes of both feet 07/29/2015   Hypercholesterolemia 11/13/2020   Inclusion cyst 03/18/2016   Ingrowing nail 07/29/2015   Migraine headache 11/13/2020   Other emphysema ([place]) 02/12/2017   PAD (peripheral artery disease) ([place]) 04/17/2019   Poorly controlled type 2 diabetes mellitus with peripheral neuropathy ([place]) 04/17/2019   RLS (restless legs syndrome) 11/13/2020    Past Surgical History:  Procedure Laterality Date   ABDOMINAL HYSTERECTOMY     CHOLECYSTECTOMY     FOOT SURGERY     ICD IMPLANT N/A 03/14/2021   Procedure: ICD IMPLANT;  Surgeon: Constance Haw, MD;  Location: [facility];  Service: Cardiovascular;  Laterality: N/A;   KNEE SURGERY Right    SHOULDER SURGERY Left     Current Medications: Current Meds  Medication Sig   albuterol (PROVENTIL) (2.5 MG/3ML) 0.083% nebulizer solution Take 2.5 mg by nebulization in the morning, at noon, in the evening, and at bedtime.   ALPRAZolam (XANAX) 0.5 MG tablet Take 0.5 mg by mouth 2 (two) times daily as needed for anxiety.   aspirin 325 MG tablet Take 325 mg by mouth daily.   atorvastatin (LIPITOR) 80 MG tablet Take 1 tablet (80 mg total) by mouth daily.   clopidogrel (PLAVIX) 75 MG tablet Take 1 tablet (75 mg total) by mouth daily.   dapagliflozin propanediol (FARXIGA) 10 MG TABS tablet Take 1 tablet (10 mg total) by mouth daily before breakfast.   dicyclomine (BENTYL) 20 MG tablet Take 10 mg by mouth daily.   EPINEPHrine 0.3 mg/0.3 mL IJ SOAJ injection Inject 0.3 mg into the muscle as needed for anaphylaxis.   fluticasone (FLONASE) 50 MCG/ACT nasal spray Place 1 spray into both nostrils 2 (two) times daily as needed for allergies.   furosemide (LASIX) 20 MG tablet Take 1 tablet (20 mg total) by mouth daily as needed for fluid or edema.   ipratropium-albuterol (DUONEB) 0.5-2.5 (3) MG/3ML SOLN  Take 3 mLs by nebulization in the morning, at noon, in the evening, and at bedtime.   metoprolol succinate (TOPROL-XL) 50 MG 24 hr tablet Take 1 tablet (50 mg total) by mouth daily. Take with or immediately following a meal.   nitroGLYCERIN (NITROSTAT) 0.4 MG SL tablet Place 1 tablet under the tongue every 5 (five) minutes x 3 doses as needed for chest pain.   ondansetron (ZOFRAN) 8 MG tablet Take 8 mg by mouth as needed for nausea.   OXYGEN Inhale 2 L into the lungs continuous. 2 liters nasal canula contious   sacubitril-valsartan (ENTRESTO) 97-103 MG Take 1 tablet by mouth 2 (two) times daily.   spironolactone (ALDACTONE) 25 MG tablet Take 1 tablet (25 mg total) by mouth daily.   [DISCONTINUED] sacubitril-valsartan (ENTRESTO) 49-51 MG Take 1 tablet by mouth 2 (two) times daily.     Allergies:   Ibuprofen, Morphine, Hydrocodone-acetaminophen, Other, Imitrex [sumatriptan], Naprosyn [naproxen], Pineapple, Chantix [varenicline], Codeine, and Tylenol [acetaminophen]   Social History   Socioeconomic History   Marital status: Widowed    Spouse name: Not on  file   Number of children: Not on file   Years of education: Not on file   Highest education level: Not on file  Occupational History   Not on file  Tobacco Use   Smoking status: Every Day    Packs/day: 2.00    Years: 60.00    Pack years: 120.00    Types: Cigarettes   Smokeless tobacco: Never  Vaping Use   Vaping Use: Never used  Substance and Sexual Activity   Alcohol use: Not Currently    Comment: Occasionally will have a 1 beer   Drug use: Yes    Types: Marijuana   Sexual activity: Not on file  Other Topics Concern   Not on file  Social History Narrative   Not on file   Social Determinants of Health   Financial Resource Strain: Not on file  Food Insecurity: Not on file  Transportation Needs: Not on file  Physical Activity: Not on file  Stress: Not on file  Social Connections: Not on file     Family History: The  patient's family history includes Alcoholism in her brother; Diabetes in her brother, mother, and sister; Failure to thrive in her brother; Heart attack in her sister; Heart attack (age of onset: 48) in her mother; Hypertension in her brother, mother, and sister; Other in her father; Stroke in her brother. ROS:   Please see the history of present illness.    All other systems reviewed and are negative.  EKGs/Labs/Other Studies Reviewed:    The following studies were reviewed today:    Recent Labs: 03/03/2021: BUN 14; Creatinine, Ser 0.54; Hemoglobin 15.6; Platelets 264; Potassium 5.1; Sodium 140  Recent Lipid Panel No results found for: CHOL, TRIG, HDL, CHOLHDL, VLDL, LDLCALC, LDLDIRECT  Physical Exam:    VS:  BP (!) 150/82 (BP Location: Left Arm, Patient Position: Sitting, Cuff Size: Normal)   Pulse 92   Ht 5\' 1"  (1.549 m)   Wt 158 lb (71.7 kg)   LMP  (LMP Unknown)   SpO2 95%   BMI 29.85 kg/m     Wt Readings from Last 3 Encounters:  03/21/21 158 lb (71.7 kg)  03/14/21 152 lb (68.9 kg)  03/03/21 155 lb 9.6 oz (70.6 kg)     GEN: She still looks chronically ill but no longer appears debilitated or frail well nourished, well developed in no acute distress HEENT: Normal NECK: No JVD; No carotid bruits LYMPHATICS: No lymphadenopathy CARDIAC: Distant heart sounds RRR, no murmurs, rubs, gallops pacemaker pocket looks good RESPIRATORY: She is hyperinflated diminished breath sounds but without rales, wheezing or rhonchi  ABDOMEN: Soft, non-tender, non-distended MUSCULOSKELETAL:  No edema; No deformity  SKIN: Warm and dry NEUROLOGIC:  Alert and oriented x 3 PSYCHIATRIC:  Normal affect    Signed, Shirlee More, MD  03/21/2021 10:33 AM    [place]

## 2021-03-24 ENCOUNTER — Ambulatory Visit (INDEPENDENT_AMBULATORY_CARE_PROVIDER_SITE_OTHER): Payer: Medicare HMO | Admitting: Cardiology

## 2021-03-24 ENCOUNTER — Other Ambulatory Visit: Payer: Self-pay

## 2021-03-24 DIAGNOSIS — I472 Ventricular tachycardia, unspecified: Secondary | ICD-10-CM

## 2021-03-24 MED ORDER — EZETIMIBE 10 MG PO TABS: 10.0000 mg | ORAL_TABLET | Freq: Every day | ORAL | 3 refills | Status: DC

## 2021-03-24 NOTE — Addendum Note (Signed)
Addended by: Truddie Hidden on: 03/24/2021 10:25 AM   Modules accepted: Orders

## 2021-03-24 NOTE — Patient Instructions (Addendum)
   After Your Pacemaker   Monitor your pacemaker site for redness, swelling, and drainage. Call the device clinic at [phone] if you experience these symptoms or fever/chills.  Your incision was closed with Steri-strips or staples:  You may shower 7 days after your procedure and wash your incision with soap and water. Avoid lotions, ointments, or perfumes over your incision until it is well-healed.  You may use a hot tub or a pool after your wound check appointment if the incision is completely closed.  Do not lift, push or pull greater than 10 pounds with the affected arm until 6 weeks after your procedure. There are no other restrictions in arm movement after your wound check appointment.  You may drive, unless driving has been restricted by your healthcare providers.  Your Pacemaker is MRI compatible.  Remote monitoring is used to monitor your pacemaker from home. This monitoring is scheduled every 91 days by our office. It allows [place] to keep an eye on the functioning of your device to ensure it is working properly. You will routinely see your Electrophysiologist annually (more often if necessary). 0

## 2021-03-25 ENCOUNTER — Telehealth: Payer: Self-pay | Admitting: Cardiology

## 2021-03-25 NOTE — Telephone Encounter (Signed)
Patient calling to see if its okay to put a warm or cold pack on where her stiches were. Please advise

## 2021-03-26 NOTE — Telephone Encounter (Signed)
Spoke with patient, she stated that wound was not swollen or red, she stated that the muscle above the incision site was sore advised patient she could put some ice but to make sure she placed a pillow case or something to protect the site, patient voiced understanding

## 2021-03-27 ENCOUNTER — Ambulatory Visit: Payer: Medicare HMO

## 2021-03-28 NOTE — Progress Notes (Signed)
Wound check appointment s/p ICD implant 03/14/21. Steri-strips removed. Wound without redness or edema. Incision edges approximated, wound well healed. Normal device function. Thresholds, sensing, and impedances consistent with implant measurements. Device programmed at 3.5V for extra safety margin until 3 month visit. Histogram distribution appropriate for patient and level of activity. No ventricular arrhythmias noted. Patient educated about wound care, arm mobility, lifting restrictions, shock plan. Patient enrolled in remote monitoring with next transmission scheduled 06/17/20. ROV follow up with Dr. Curt Bears 06/16/2021.

## 2021-04-07 ENCOUNTER — Institutional Professional Consult (permissible substitution): Payer: Medicare HMO | Admitting: Cardiology

## 2021-04-17 ENCOUNTER — Emergency Department (HOSPITAL_COMMUNITY): Payer: Medicare HMO

## 2021-04-17 ENCOUNTER — Emergency Department (HOSPITAL_COMMUNITY)
Admission: EM | Admit: 2021-04-17 | Discharge: 2021-04-18 | Discharge status: Against Medical Advice | Payer: Medicare HMO | Attending: Emergency Medicine | Admitting: Emergency Medicine

## 2021-04-17 DIAGNOSIS — I10 Essential (primary) hypertension: Secondary | ICD-10-CM | POA: Diagnosis not present

## 2021-04-17 DIAGNOSIS — E119 Type 2 diabetes mellitus without complications: Secondary | ICD-10-CM | POA: Diagnosis not present

## 2021-04-17 DIAGNOSIS — J45909 Unspecified asthma, uncomplicated: Secondary | ICD-10-CM | POA: Insufficient documentation

## 2021-04-17 DIAGNOSIS — R4781 Slurred speech: Secondary | ICD-10-CM | POA: Insufficient documentation

## 2021-04-17 DIAGNOSIS — W19XXXA Unspecified fall, initial encounter: Secondary | ICD-10-CM | POA: Diagnosis not present

## 2021-04-17 DIAGNOSIS — F17210 Nicotine dependence, cigarettes, uncomplicated: Secondary | ICD-10-CM | POA: Diagnosis not present

## 2021-04-17 DIAGNOSIS — Z79899 Other long term (current) drug therapy: Secondary | ICD-10-CM | POA: Insufficient documentation

## 2021-04-17 DIAGNOSIS — R55 Syncope and collapse: Secondary | ICD-10-CM | POA: Insufficient documentation

## 2021-04-17 DIAGNOSIS — Z7982 Long term (current) use of aspirin: Secondary | ICD-10-CM | POA: Insufficient documentation

## 2021-04-17 DIAGNOSIS — Z7952 Long term (current) use of systemic steroids: Secondary | ICD-10-CM | POA: Diagnosis not present

## 2021-04-17 DIAGNOSIS — R519 Headache, unspecified: Secondary | ICD-10-CM | POA: Diagnosis present

## 2021-04-17 DIAGNOSIS — Z85828 Personal history of other malignant neoplasm of skin: Secondary | ICD-10-CM | POA: Diagnosis not present

## 2021-04-17 DIAGNOSIS — Z20822 Contact with and (suspected) exposure to covid-19: Secondary | ICD-10-CM | POA: Insufficient documentation

## 2021-04-17 LAB — COMPREHENSIVE METABOLIC PANEL
ALT: 11 U/L (ref 0–44)
AST: 12 U/L — ABNORMAL LOW (ref 15–41)
Albumin: 3.5 g/dL (ref 3.5–5.0)
Alkaline Phosphatase: 64 U/L (ref 38–126)
Anion gap: 10 (ref 5–15)
BUN: 14 mg/dL (ref 8–23)
CO2: 27 mmol/L (ref 22–32)
Calcium: 9.5 mg/dL (ref 8.9–10.3)
Chloride: 101 mmol/L (ref 98–111)
Creatinine, Ser: 0.53 mg/dL (ref 0.44–1.00)
GFR, Estimated: >60 mL/min (ref >60)
Glucose, Bld: 198 mg/dL — ABNORMAL HIGH (ref 70–99)
Potassium: 4.5 mmol/L (ref 3.5–5.1)
Sodium: 138 mmol/L (ref 135–145)
Total Bilirubin: 0.5 mg/dL (ref 0.3–1.2)
Total Protein: 6.4 g/dL — ABNORMAL LOW (ref 6.5–8.1)

## 2021-04-17 LAB — URINALYSIS, ROUTINE W REFLEX MICROSCOPIC
Bacteria, UA: NONE SEEN
Bilirubin Urine: NEGATIVE
Glucose, UA: NEGATIVE mg/dL
Ketones, ur: NEGATIVE mg/dL
Leukocytes,Ua: NEGATIVE
Nitrite: NEGATIVE
Protein, ur: NEGATIVE mg/dL
Specific Gravity, Urine: 1.008 (ref 1.005–1.030)
pH: 8 (ref 5.0–8.0)

## 2021-04-17 LAB — PROTIME-INR
INR: 1 (ref 0.8–1.2)
Prothrombin Time: 12.7 seconds (ref 11.4–15.2)

## 2021-04-17 MED ORDER — ACETAMINOPHEN 325 MG PO TABS
650.0000 mg | ORAL_TABLET | Freq: Once | ORAL | Status: DC
  Filled 2021-04-17: qty 2

## 2021-04-17 NOTE — ED Notes (Addendum)
Pt refusing to let this RN draw labs, pt refusing to let this RN get an EKG - pt sitting up at side of bed stating that if she does not know something in the next 5 minutes she is "going the fuck home" - visitor is present at bedside - MD notified

## 2021-04-17 NOTE — Progress Notes (Signed)
Orthopedic Tech Progress Note Patient Details:  Casey Ramos 1955/12/01 [id number] Level 2 trauma Patient ID: Casey Ramos, female   DOB: 22-Jul-1955, 65 y.o.   MRN: [id number]  Casey Ramos 04/17/2021, 9:22 PM

## 2021-04-17 NOTE — ED Notes (Signed)
Refusing tylenol, requesting dilaudid

## 2021-04-17 NOTE — ED Notes (Signed)
Attempted IV access with ultrasound without success

## 2021-04-17 NOTE — ED Notes (Addendum)
Trauma Response Nurse Note-  Reason for Call / Reason for Trauma activation:   - fall on thinners with altered level of consciousness  Initial Focused Assessment (If applicable, or please see trauma documentation):  - slurred speech, alertx4, ccollar in place, no obvious trauma noted  Interventions:  - Attempted IV start including use of ultrasound without success, partial trauma lab draw, CT/XRAY, assisted with family contact, urine collection, COVID swab  Plan of Care as of this note:  - Remainder of trauma panel pending second staff member attempt, IV team consulted. Imaging negative  Event Summary:   - Patient arrives via EMS from home after a ground level fall, takes plavix at home. Per EMS en route patient became altered with slurred speech. Alert x4 upon arrival. C/o headache with EMS. EMS initiated IV access in L wrist - site inflamed/red upon arrival. Phlebotomy tourniquet on right arm at time of arrival placed by EMS for unknown amount of time, removed on arrival. Patient alert x4 at time of arrival. No obvious trauma upon assessment. Patient only complaining about needing to pee at time of arrival - purewick placed by primary RN. IAttempted lab draw/IV start x2, able to collect blue top and light green top tube but no more. Phlebotomist attempted as well with no success. Portable xrays and escorted to CT by TRN. IV attempted with ultrasound upon return to treatment room. Patient requesting pain management for headache, EDP consulted for orders. Patient allergic to most pain meds, EDP notified. Order for tylenol given, which patient refused. States "I guess I'll just lay here and suffer." Requesting Dilaudid. IV team at bedside to attempt lab draw and IV start.  The Following (if applicable):    -MD notified: [place]    -Time of Page/Time of notification: 2042    -TRN arrival Time: 2042    -End time: 2245

## 2021-04-17 NOTE — ED Notes (Signed)
Pt taken to CT with TRN

## 2021-04-17 NOTE — ED Provider Notes (Addendum)
[facility] EMERGENCY DEPARTMENT Provider Note   CSN: [id number] Arrival date & time: 04/17/21  2054     History Chief Complaint  Patient presents with   level 2 fall on thinners    Casey ELDRIDGE is a 65 y.o. female.  HPI  65 year old female presenting to the emergency department by EMS after ground-level fall.  The patient was leveled due to her being on Plavix.  She had brief slurred speech and alteration in her mental status with EMS reportedly but arrived to the emergency department AOx4, at her baseline mental status, GCS 15, ABC intact.  She complained of a headache on arrival.  No other complaint of injuries.  Unclear etiology of the patient's fall.  Unclear syncopal episode versus loss of consciousness.  She did have an internal defibrillator placed 2 to 3 weeks ago.  She has a history of COPD and is on 2 L O2 at baseline.  Level 5 caveat due to patient acuity on arrival.  Past Medical History:  Diagnosis Date   Asthma    Bartholin's gland abscess 11/13/2020   Benign neoplasm of skin or subcutaneous tissue 02/26/2016   Chronic toe pain, right foot 04/17/2019   Formatting of this note might be different from the original. First and second toes right   Depression    Diabetes mellitus without complication ([place]) 9/56/2130   Dyslipidemia 02/12/2017   Essential hypertension 02/12/2017   GERD (gastroesophageal reflux disease) 11/13/2020   H/O: CVA (cerebrovascular accident) 02/12/2017   Hammer toes of both feet 07/29/2015   Hypercholesterolemia 11/13/2020   Inclusion cyst 03/18/2016   Ingrowing nail 07/29/2015   Migraine headache 11/13/2020   Other emphysema ([place]) 02/12/2017   PAD (peripheral artery disease) ([place]) 04/17/2019   Poorly controlled type 2 diabetes mellitus with peripheral neuropathy ([place]) 04/17/2019   RLS (restless legs syndrome) 11/13/2020    Patient Active Problem List   Diagnosis Date Noted   Asthma 01/27/2021   Depression 01/27/2021   Bartholin's  gland abscess 11/13/2020   GERD (gastroesophageal reflux disease) 11/13/2020   Hypercholesterolemia 11/13/2020   Migraine headache 11/13/2020   RLS (restless legs syndrome) 11/13/2020   Chronic toe pain, right foot 04/17/2019   Poorly controlled type 2 diabetes mellitus with peripheral neuropathy ([place]) 04/17/2019   PAD (peripheral artery disease) ([place]) 04/17/2019   Current smoker 02/12/2017   Dyslipidemia 02/12/2017   Hypertensive heart disease 02/12/2017   H/O: CVA (cerebrovascular accident) 02/12/2017   Other emphysema ([place]) 02/12/2017   Essential hypertension 02/12/2017   Inclusion cyst 03/18/2016   Benign neoplasm of skin or subcutaneous tissue 02/26/2016   Diabetes mellitus without complication ([place]) 86/57/8469   Hammer toes of both feet 07/29/2015   Ingrowing nail 07/29/2015    Past Surgical History:  Procedure Laterality Date   ABDOMINAL HYSTERECTOMY     CHOLECYSTECTOMY     FOOT SURGERY     ICD IMPLANT N/A 03/14/2021   Procedure: ICD IMPLANT;  Surgeon: Constance Haw, MD;  Location: [facility];  Service: Cardiovascular;  Laterality: N/A;   KNEE SURGERY Right    SHOULDER SURGERY Left      OB History   No obstetric history on file.     Family History  Problem Relation Age of Onset   Hypertension Mother    Diabetes Mother    Heart attack Mother 81   Other Father        Black Lung / Ecologist   Hypertension Sister  Diabetes Sister    Heart attack Sister    Hypertension Brother    Diabetes Brother    Stroke Brother    Failure to thrive Brother        Quit eating   Alcoholism Brother     Social History   Tobacco Use   Smoking status: Every Day    Packs/day: 2.00    Years: 60.00    Pack years: 120.00    Types: Cigarettes   Smokeless tobacco: Never  Vaping Use   Vaping Use: Never used  Substance Use Topics   Alcohol use: Not Currently    Comment: Occasionally will have a 1 beer   Drug use: Yes    Types: Marijuana    Home  Medications Prior to Admission medications   Medication Sig Start Date End Date Taking? Authorizing Provider  albuterol (PROVENTIL) (2.5 MG/3ML) 0.083% nebulizer solution Take 2.5 mg by nebulization in the morning, at noon, in the evening, and at bedtime.    [provider]  ALPRAZolam Duanne Moron) 0.5 MG tablet Take 0.5 mg by mouth 2 (two) times daily as needed for anxiety.    [provider]  aspirin 325 MG tablet Take 325 mg by mouth daily. 12/09/20   [provider]  atorvastatin (LIPITOR) 80 MG tablet Take 1 tablet (80 mg total) by mouth daily. 01/28/21   Richardo Priest, MD  clopidogrel (PLAVIX) 75 MG tablet Take 1 tablet (75 mg total) by mouth daily. 01/28/21   Richardo Priest, MD  dapagliflozin propanediol (FARXIGA) 10 MG TABS tablet Take 1 tablet (10 mg total) by mouth daily before breakfast. 03/21/21   Richardo Priest, MD  dicyclomine (BENTYL) 20 MG tablet Take 10 mg by mouth daily. 10/17/18   [provider]  EPINEPHrine 0.3 mg/0.3 mL IJ SOAJ injection Inject 0.3 mg into the muscle as needed for anaphylaxis.    [provider]  ezetimibe (ZETIA) 10 MG tablet Take 1 tablet (10 mg total) by mouth daily. 03/24/21 06/22/21  Richardo Priest, MD  fluticasone (FLONASE) 50 MCG/ACT nasal spray Place 1 spray into both nostrils 2 (two) times daily as needed for allergies. 01/17/19   [provider]  furosemide (LASIX) 20 MG tablet Take 1 tablet (20 mg total) by mouth daily as needed for fluid or edema. 01/28/21   Richardo Priest, MD  ipratropium-albuterol (DUONEB) 0.5-2.5 (3) MG/3ML SOLN Take 3 mLs by nebulization in the morning, at noon, in the evening, and at bedtime.    [provider]  metoprolol succinate (TOPROL-XL) 50 MG 24 hr tablet Take 1 tablet (50 mg total) by mouth daily. Take with or immediately following a meal. 03/03/21   Camnitz, Ocie Doyne, MD  nitroGLYCERIN (NITROSTAT) 0.4 MG SL tablet Place 1 tablet under the tongue every 5 (five)  minutes x 3 doses as needed for chest pain. 12/08/20   [provider]  ondansetron (ZOFRAN) 8 MG tablet Take 8 mg by mouth as needed for nausea. 09/06/18   [provider]  OXYGEN Inhale 2 L into the lungs continuous. 2 liters nasal canula contious    [provider]  sacubitril-valsartan (ENTRESTO) 97-103 MG Take 1 tablet by mouth 2 (two) times daily. 03/21/21   Richardo Priest, MD  spironolactone (ALDACTONE) 25 MG tablet Take 1 tablet (25 mg total) by mouth daily. 03/03/21   Camnitz, Ocie Doyne, MD    Allergies    Ibuprofen, Morphine, Hydrocodone-acetaminophen, Other, Imitrex [sumatriptan], Naprosyn [naproxen], Pineapple, Chantix [  varenicline], Codeine, and Tylenol [acetaminophen]  Review of Systems   Review of Systems  Unable to perform ROS: Acuity of condition   Physical Exam Updated Vital Signs BP 118/74   Pulse 88   Temp 97.8 F (36.6 C) (Oral)   Resp (!) 22   Ht 5\' 1"  (1.549 m)   Wt 72.6 kg   LMP  (LMP Unknown)   SpO2 97%   BMI 30.23 kg/m   Physical Exam Vitals and nursing note reviewed.  Constitutional:      General: She is not in acute distress.    Appearance: She is well-developed.     Comments: GCS 15, ABC intact  HENT:     Head: Normocephalic and atraumatic.  Eyes:     Extraocular Movements: Extraocular movements intact.     Conjunctiva/sclera: Conjunctivae normal.     Pupils: Pupils are equal, round, and reactive to light.  Neck:     Comments: No midline tenderness to palpation of the cervical spine.  Range of motion intact.   C-collar in place Cardiovascular:     Rate and Rhythm: Normal rate and regular rhythm.     Heart sounds: No murmur heard. Pulmonary:     Effort: Pulmonary effort is normal. No respiratory distress.     Breath sounds: Wheezing present.     Comments: Bilateral diffuse expiratory wheezing present, no respiratory distress.  On baseline 2 L O2 via nasal cannula. Chest:     Comments: Clavicles stable nontender to  AP compression.  Chest wall stable and nontender to AP and lateral compression. Abdominal:     Palpations: Abdomen is soft.     Tenderness: There is no abdominal tenderness.  Musculoskeletal:     Cervical back: Neck supple.     Comments: No midline tenderness to palpation of the thoracic or lumbar spine.  Extremities atraumatic with intact range of motion  Skin:    General: Skin is warm and dry.  Neurological:     Mental Status: She is alert.     Comments: Cranial nerves II through XII grossly intact.  Moving all 4 extremities spontaneously.  Sensation grossly intact all 4 extremities    ED Results / Procedures / Treatments   Labs (all labs ordered are listed, but only abnormal results are displayed) Labs Reviewed  COMPREHENSIVE METABOLIC PANEL - Abnormal; Notable for the following components:      Result Value   Glucose, Bld 198 (*)    Total Protein 6.4 (*)    AST 12 (*)    All other components within normal limits  URINALYSIS, ROUTINE W REFLEX MICROSCOPIC - Abnormal; Notable for the following components:   Color, Urine STRAW (*)    Hgb urine dipstick SMALL (*)    All other components within normal limits  LACTIC ACID, PLASMA - Abnormal; Notable for the following components:   Lactic Acid, Venous 2.0 (*)    All other components within normal limits  RESP PANEL BY RT-PCR (FLU A&B, COVID) ARPGX2  ETHANOL  PROTIME-INR    EKG None  Radiology CT HEAD WO CONTRAST  Result Date: 04/17/2021 CLINICAL DATA:  [place] out of bed EXAM: CT HEAD WITHOUT CONTRAST CT CERVICAL SPINE WITHOUT CONTRAST TECHNIQUE: Multidetector CT imaging of the head and cervical spine was performed following the standard protocol without intravenous contrast. Multiplanar CT image reconstructions of the cervical spine were also generated. COMPARISON:  CT brain 12/05/2020 FINDINGS: CT HEAD FINDINGS Brain: No acute territorial infarction, hemorrhage, or intracranial mass. Mild atrophy. Mild  chronic small vessel  ischemic changes of the white matter. Chronic left occipital encephalomalacia/infarct. Chronic right thalamic infarct. Stable ventricle size. Vascular: No hyperdense vessels. Vertebral and carotid vascular calcification Skull: Normal. Negative for fracture or focal lesion. Sinuses/Orbits: Patchy mucosal thickening in the sinuses Other: None CT CERVICAL SPINE FINDINGS Alignment: Motion degradation. No subluxation. Facet alignment within normal limits. Skull base and vertebrae: No acute fracture. No primary bone lesion or focal pathologic process. Soft tissues and spinal canal: No prevertebral fluid or swelling. No visible canal hematoma. Disc levels: Mild disc space narrowing and degenerative change at multiple levels. Facet degenerative change at multiple levels. Upper chest: Negative. Other: None IMPRESSION: 1. No CT evidence for acute intracranial abnormality. Chronic left occipital and right thalamic infarcts. Mild atrophy 2. Motion degradation of the cervical spine slightly limits assessment. No definite acute osseous abnormality Electronically Signed   By: Donavan Foil M.D.   On: 04/17/2021 22:03   CT CERVICAL SPINE WO CONTRAST  Result Date: 04/17/2021 CLINICAL DATA:  [place] out of bed EXAM: CT HEAD WITHOUT CONTRAST CT CERVICAL SPINE WITHOUT CONTRAST TECHNIQUE: Multidetector CT imaging of the head and cervical spine was performed following the standard protocol without intravenous contrast. Multiplanar CT image reconstructions of the cervical spine were also generated. COMPARISON:  CT brain 12/05/2020 FINDINGS: CT HEAD FINDINGS Brain: No acute territorial infarction, hemorrhage, or intracranial mass. Mild atrophy. Mild chronic small vessel ischemic changes of the white matter. Chronic left occipital encephalomalacia/infarct. Chronic right thalamic infarct. Stable ventricle size. Vascular: No hyperdense vessels. Vertebral and carotid vascular calcification Skull: Normal. Negative for fracture or focal  lesion. Sinuses/Orbits: Patchy mucosal thickening in the sinuses Other: None CT CERVICAL SPINE FINDINGS Alignment: Motion degradation. No subluxation. Facet alignment within normal limits. Skull base and vertebrae: No acute fracture. No primary bone lesion or focal pathologic process. Soft tissues and spinal canal: No prevertebral fluid or swelling. No visible canal hematoma. Disc levels: Mild disc space narrowing and degenerative change at multiple levels. Facet degenerative change at multiple levels. Upper chest: Negative. Other: None IMPRESSION: 1. No CT evidence for acute intracranial abnormality. Chronic left occipital and right thalamic infarcts. Mild atrophy 2. Motion degradation of the cervical spine slightly limits assessment. No definite acute osseous abnormality Electronically Signed   By: Donavan Foil M.D.   On: 04/17/2021 22:03   DG Pelvis Portable  Result Date: 04/17/2021 CLINICAL DATA:  Fall. EXAM: PORTABLE PELVIS 1-2 VIEWS COMPARISON:  None. FINDINGS: No acute fracture or dislocation. The bones are osteopenic. The soft tissues are unremarkable. IMPRESSION: Negative. Electronically Signed   By: Anner Crete M.D.   On: 04/17/2021 21:17   DG Chest Port 1 View  Result Date: 04/17/2021 CLINICAL DATA:  Fall.  Level 2 trauma. EXAM: PORTABLE CHEST 1 VIEW COMPARISON:  03/14/2021 FINDINGS: Pacer/AICD device with tip at right ventricle. Left shoulder arthroplasty. Midline trachea. Mild cardiomegaly. Atherosclerosis in the transverse aorta. No pleural effusion or pneumothorax. No congestive failure. Clear lungs. No free intraperitoneal air. IMPRESSION: No acute cardiopulmonary disease. Cardiomegaly without congestive failure. Aortic Atherosclerosis (ICD10-I70.0). Electronically Signed   By: Abigail Miyamoto M.D.   On: 04/17/2021 21:17    Procedures Procedures   Medications Ordered in ED Medications - No data to display  ED Course  I have reviewed the triage vital signs and the nursing  notes.  Pertinent labs & imaging results that were available during my care of the patient were reviewed by me and considered in my medical decision making (see chart for  details).    MDM Rules/Calculators/A&P                            65 year old female presenting to the emergency department by EMS after ground-level fall.  The patient was leveled due to her being on Plavix.  She had brief slurred speech and alteration in her mental status with EMS reportedly but arrived to the emergency department AOx4, at her baseline mental status, GCS 15, ABC intact.  She complained of a headache on arrival.  No other complaint of injuries.  Unclear etiology of the patient's fall.  Unclear syncopal episode versus loss of consciousness.  She did have an internal defibrillator placed 2 to 3 weeks ago.  She has a history of COPD and is on 2 L O2 at baseline.    Trauma lab work-up initiated to include lactic acid which was mildly elevated 2.0, ethanol level which was normal, PT/INR Negative, COVID-19 and influenza PCR negative, urinalysis without evidence of UTI, CMP normal, CBC unable to be collected due to poor lab draw.  Trauma imaging to include CT of the head, CT cervical spine, chest and pelvis x-ray negative for acute traumatic injuries, no acute intracranial abnormality.  Plan to further work-up the etiology of the patient's fall, evaluate for possible syncope versus other medical etiology.  Had plan for EKG, further laboratory work-up, pacemaker interrogation.  On reevaluation, the patient was irate that she had not received medication for headache.  She specifically requested Dilaudid.  I explained to the patient that I did not prescribe Dilaudid for headaches.  At this, the patient requested to leave the emergency department.  I explained that the patient's work-up was not yet complete and informed the patient that she would need to sign out AMA.  The patient was alert and oriented and demonstrated  capacity to make this decision.  She appears neurologically intact and has no evidence of intracranial injury on CT head.  She subsequently left AGAINST MEDICAL ADVICE/eloped from the emergency department prior to completion of her full work-up.  Final Clinical Impression(s) / ED Diagnoses Final diagnoses:  Fall    Rx / DC Orders ED Discharge Orders     None        Regan Lemming, MD 04/18/21 1633    Regan Lemming, MD 04/18/21 1635

## 2021-04-17 NOTE — ED Triage Notes (Signed)
Pt BIB EMS from home. Pt had unwitnessed fall at home, on Plavix, unknown if pt hit head. Per EMS pt called EMS herself but then became altered on the call and had slurred speech. Per EMS pt did not remember having surgery a couple weeks ago or where she was.  Pt had internal defib placed 2-3 wks ago - has hx of COPD - on 2L [place] and hx of MI  C collar on  EMS gave 4 of zofran

## 2021-04-17 NOTE — ED Notes (Signed)
Patient requesting medication for headache - allergy list shows allergies to most pain meds. EDP consulted, verbal order for tylenol given.

## 2021-04-17 NOTE — Progress Notes (Signed)
   04/17/21 2106  Clinical Encounter Type  Visited With Patient  Visit Type Initial  Referral From Nurse  Consult/Referral To Chaplain  Spiritual Encounters  Spiritual Needs Emotional;Prayer  Stress Factors  Patient Stress Factors None identified  Family Stress Factors None identified  Advance Directives (For Healthcare)  Does Patient Have a Medical Advance Directive? Unable to assess, patient is non-responsive or altered mental status    Chaplain arrived to ED room and provided spiritual and emotional support to patient and staff as needed.  No family present, No emotional or spiritual distress exhibited.  Will remain available for any additional support as needed.

## 2021-04-18 LAB — ETHANOL: Alcohol, Ethyl (B): <10 mg/dL (ref <10)

## 2021-04-18 LAB — LACTIC ACID, PLASMA: Lactic Acid, Venous: 2 mmol/L (ref 0.5–1.9)

## 2021-04-18 LAB — RESP PANEL BY RT-PCR (FLU A&B, COVID) ARPGX2
Influenza A by PCR: NEGATIVE
Influenza B by PCR: NEGATIVE
SARS Coronavirus 2 by RT PCR: NEGATIVE

## 2021-04-18 NOTE — ED Notes (Signed)
MD to speak to pt - MD notified this pt that pt is leaving AMA

## 2021-04-18 NOTE — ED Notes (Signed)
Pt is refusing to sign AMA papers, visitor at bedside - MD notified

## 2021-06-16 ENCOUNTER — Ambulatory Visit (INDEPENDENT_AMBULATORY_CARE_PROVIDER_SITE_OTHER): Payer: Medicare HMO | Admitting: Cardiology

## 2021-06-16 ENCOUNTER — Encounter: Payer: Self-pay | Admitting: Cardiology

## 2021-06-16 ENCOUNTER — Other Ambulatory Visit: Payer: Self-pay

## 2021-06-16 VITALS — BP 130/56 | HR 93 | Ht 61.0 in | Wt 155.0 lb

## 2021-06-16 DIAGNOSIS — I5022 Chronic systolic (congestive) heart failure: Secondary | ICD-10-CM | POA: Diagnosis not present

## 2021-06-16 NOTE — Progress Notes (Signed)
Electrophysiology Office Note   Date:  06/16/2021   ID:  Casey Ramos, DOB 1956/03/01, MRN [id number]  PCP:  Aldona Bar, MD  Cardiologist:  Bettina Gavia Primary Electrophysiologist:  Meryn Sarracino Meredith Leeds, MD    Chief Complaint: VT, CHF   History of Present Illness: Casey Ramos is a 66 y.o. female who is being seen today for the evaluation of VT, CHF at the request of Aldona Bar, MD. Presenting today for electrophysiology evaluation.  She has a history significant for COPD, struct of sleep apnea, substance abuse.  She was admitted to [facility] 12/05/2020 with acute ST elevation MI and stenting of the RCA.  She then presented to [facility] with respiratory distress in setting of a Xanax overdose.  She was intubated and transferred to [facility].  She was noted to have ST segment elevations.  Ejection fraction was found to be 30 to 35%.  She is now status post Medtronic ICD implanted 03/14/2021.  Today, denies symptoms of palpitations, chest pain, orthopnea, PND, lower extremity edema, claudication, dizziness, presyncope, syncope, bleeding, or neurologic sequela. The patient is tolerating medications without difficulties.  She is overall doing well.  She is happy with her device.  She has no complaints other than shortness of breath.  Her shortness of breath is chronic, though it has worsened over the last few days.   Past Medical History:  Diagnosis Date   Asthma    Bartholin's gland abscess 11/13/2020   Benign neoplasm of skin or subcutaneous tissue 02/26/2016   Chronic toe pain, right foot 04/17/2019   Formatting of this note might be different from the original. First and second toes right   Depression    Diabetes mellitus without complication ([place]) 0/34/7425   Dyslipidemia 02/12/2017   Essential hypertension 02/12/2017   GERD (gastroesophageal reflux disease) 11/13/2020   H/O: CVA (cerebrovascular accident) 02/12/2017   Hammer toes of both feet  07/29/2015   Hypercholesterolemia 11/13/2020   Inclusion cyst 03/18/2016   Ingrowing nail 07/29/2015   Migraine headache 11/13/2020   Other emphysema ([place]) 02/12/2017   PAD (peripheral artery disease) ([place]) 04/17/2019   Poorly controlled type 2 diabetes mellitus with peripheral neuropathy ([place]) 04/17/2019   RLS (restless legs syndrome) 11/13/2020   Past Surgical History:  Procedure Laterality Date   ABDOMINAL HYSTERECTOMY     CHOLECYSTECTOMY     FOOT SURGERY     ICD IMPLANT N/A 03/14/2021   Procedure: ICD IMPLANT;  Surgeon: Constance Haw, MD;  Location: [place] [facility];  Service: Cardiovascular;  Laterality: N/A;   KNEE SURGERY Right    SHOULDER SURGERY Left      Current Outpatient Medications  Medication Sig Dispense Refill   albuterol (PROVENTIL) (2.5 MG/3ML) 0.083% nebulizer solution Take 2.5 mg by nebulization in the morning, at noon, in the evening, and at bedtime.     ALPRAZolam (XANAX) 0.5 MG tablet Take 0.5 mg by mouth 2 (two) times daily as needed for anxiety.     aspirin 325 MG tablet Take 325 mg by mouth daily.     atorvastatin (LIPITOR) 80 MG tablet Take 1 tablet (80 mg total) by mouth daily. 90 tablet 3   clopidogrel (PLAVIX) 75 MG tablet Take 1 tablet (75 mg total) by mouth daily. 90 tablet 3   dapagliflozin propanediol (FARXIGA) 10 MG TABS tablet Take 1 tablet (10 mg total) by mouth daily before breakfast. 90 tablet 3   dicyclomine (BENTYL) 20 MG tablet Take 10 mg by  mouth daily.     EPINEPHrine 0.3 mg/0.3 mL IJ SOAJ injection Inject 0.3 mg into the muscle as needed for anaphylaxis.     ezetimibe (ZETIA) 10 MG tablet Take 1 tablet (10 mg total) by mouth daily. 90 tablet 3   fluticasone (FLONASE) 50 MCG/ACT nasal spray Place 1 spray into both nostrils 2 (two) times daily as needed for allergies.     furosemide (LASIX) 20 MG tablet Take 1 tablet (20 mg total) by mouth daily as needed for fluid or edema. 45 tablet 3   ipratropium-albuterol (DUONEB) 0.5-2.5 (3) MG/3ML SOLN  Take 3 mLs by nebulization in the morning, at noon, in the evening, and at bedtime.     metoprolol succinate (TOPROL-XL) 50 MG 24 hr tablet Take 1 tablet (50 mg total) by mouth daily. Take with or immediately following a meal. 90 tablet 3   nitroGLYCERIN (NITROSTAT) 0.4 MG SL tablet Place 1 tablet under the tongue every 5 (five) minutes x 3 doses as needed for chest pain.     ondansetron (ZOFRAN) 8 MG tablet Take 8 mg by mouth as needed for nausea.     OXYGEN Inhale 2 L into the lungs continuous. 2 liters nasal canula contious     sacubitril-valsartan (ENTRESTO) 97-103 MG Take 1 tablet by mouth 2 (two) times daily. 180 tablet 3   spironolactone (ALDACTONE) 25 MG tablet Take 1 tablet (25 mg total) by mouth daily. 30 tablet 3   No current facility-administered medications for this visit.    Allergies:   Ibuprofen, Morphine, Hydrocodone-acetaminophen, Other, Imitrex [sumatriptan], Naprosyn [naproxen], Pineapple, Chantix [varenicline], Codeine, and Tylenol [acetaminophen]   Social History:  The patient  reports that she has been smoking cigarettes. She has a 120.00 pack-year smoking history. She has never used smokeless tobacco. She reports that she does not currently use alcohol. She reports current drug use. Drug: Marijuana.   Family History:  The patient's family history includes Alcoholism in her brother; Diabetes in her brother, mother, and sister; Failure to thrive in her brother; Heart attack in her sister; Heart attack (age of onset: 85) in her mother; Hypertension in her brother, mother, and sister; Other in her father; Stroke in her brother.   ROS:  Please see the history of present illness.   Otherwise, review of systems is positive for none.   All other systems are reviewed and negative.   PHYSICAL EXAM: VS:  BP (!) 130/56    Pulse 93    Ht 5\' 1"  (1.549 m)    Wt 155 lb (70.3 kg)    LMP  (LMP Unknown)    SpO2 93%    BMI 29.29 kg/m  , BMI Body mass index is 29.29 kg/m. GEN: Well  nourished, well developed, in no acute distress  HEENT: normal  Neck: no JVD, carotid bruits, or masses Cardiac: RRR; no murmurs, rubs, or gallops,no edema  Respiratory:  clear to auscultation bilaterally, normal work of breathing GI: soft, nontender, nondistended, + BS MS: no deformity or atrophy  Skin: warm and dry, device site well healed Neuro:  Strength and sensation are intact Psych: euthymic mood, full affect  EKG:  EKG is not ordered today. Personal review of the ekg ordered 03/14/21 shows sinus rhythm  Personal review of the device interrogation today. Results in [place]: 03/03/2021: Hemoglobin 15.6; Platelets 264 04/17/2021: ALT 11; BUN 14; Creatinine, Ser 0.53; Potassium 4.5; Sodium 138    Lipid Panel     Component Value Date/Time  CHOL 271 (H) 03/21/2021 1038   TRIG 115 03/21/2021 1038   HDL 67 03/21/2021 1038   CHOLHDL 4.0 03/21/2021 1038   LDLCALC 184 (H) 03/21/2021 1038     Wt Readings from Last 3 Encounters:  06/16/21 155 lb (70.3 kg)  04/17/21 160 lb (72.6 kg)  03/21/21 158 lb (71.7 kg)      Other studies Reviewed: Additional studies/ records that were reviewed today include: TTE 01/02/21  Review of the above records today demonstrates:   1. Left ventricular ejection fraction, by estimation, is 30 to 35%. The  left ventricle has moderately decreased function. The left ventricle  demonstrates regional wall motion abnormalities (see scoring  diagram/findings for description). Left ventricular   diastolic parameters are consistent with Grade I diastolic dysfunction  (impaired relaxation).   2. Right ventricular systolic function is normal. The right ventricular  size is normal. There is normal pulmonary artery systolic pressure.   3. The mitral valve is normal in structure. No evidence of mitral valve  regurgitation. No evidence of mitral stenosis.   4. The aortic valve is normal in structure. Aortic valve regurgitation is  trivial. No  aortic stenosis is present.   5. The inferior vena cava is normal in size with greater than 50%  respiratory variability, suggesting right atrial pressure of 3 mmHg.   Cardiac monitor is 02/16/2021 personally reviewed There were 11 triggered and 8 diary events all sinus rhythm sinus tachycardia predominantly associated with PVCs. Ventricular ectopy was occasional 76 couplets 1 triplet and 3 episodes of nonsustained VT to the longest 37 complexes at rate of 150 bpm monomorphic. Supraventricular ectopy was rare and there were no episodes of atrial fibrillation or flutter  ASSESSMENT AND PLAN:  1.  Coronary artery disease: Status post inferior STEMI.  Stents placed to the RCA.  Currently on Plavix 75 mg daily, aspirin 81 mg daily.  No current chest pain.  2.  Chronic systolic heart failure due to ischemic cardiomyopathy: Currently on Entresto 49/51 mg twice daily, Toprol-XL 25 mg daily, Aldactone 25 mg daily.  Status post Medtronic ICD implanted 03/14/2021.  Device functioning appropriately.  Has chronic shortness of breath but this is worsened over the last few days.  OptiVol shows elevated fluid.  She Deysha Cartier take her Lasix for the next 3 days.  3.  Hyperlipidemia: Continue atorvastatin 80 mg per primary cardiology  4.  Tobacco abuse: Complete cessation encouraged.  Current medicines are reviewed at length with the patient today.   The patient does not have concerns regarding her medicines.  The following changes were made today:  none  Labs/ tests ordered today include:  No orders of the defined types were placed in this encounter.    Disposition:   FU with Elsi Stelzer 9 months  Signed, Vennie Salsbury Meredith Leeds, MD  06/16/2021 2:11 PM     [address] [place] [address] [phone] (office) [phone] (fax)

## 2021-06-17 ENCOUNTER — Ambulatory Visit (INDEPENDENT_AMBULATORY_CARE_PROVIDER_SITE_OTHER): Payer: Medicare HMO

## 2021-06-17 DIAGNOSIS — I255 Ischemic cardiomyopathy: Secondary | ICD-10-CM | POA: Diagnosis not present

## 2021-06-17 LAB — CUP PACEART REMOTE DEVICE CHECK
Battery Remaining Longevity: 135 mo
Battery Voltage: 3.11 V
Brady Statistic RV Percent Paced: 0.01 %
Date Time Interrogation Session: 20230110022822
HighPow Impedance: 84 Ohm
Implantable Lead Implant Date: 20221007
Implantable Lead Location: 753860
Implantable Pulse Generator Implant Date: 20221007
Lead Channel Impedance Value: 513 Ohm
Lead Channel Impedance Value: 589 Ohm
Lead Channel Pacing Threshold Amplitude: 0.5 V
Lead Channel Pacing Threshold Pulse Width: 0.4 ms
Lead Channel Sensing Intrinsic Amplitude: 12.25 mV
Lead Channel Sensing Intrinsic Amplitude: 12.25 mV
Lead Channel Setting Pacing Amplitude: 2 V
Lead Channel Setting Pacing Pulse Width: 0.4 ms
Lead Channel Setting Sensing Sensitivity: 0.3 mV

## 2021-06-26 NOTE — Progress Notes (Signed)
Remote ICD transmission.   

## 2021-06-29 NOTE — Progress Notes (Deleted)
Cardiology Office Note:    Date:  06/29/2021   ID:  Casey Ramos, DOB 18-Sep-1955, MRN [id number]  PCP:  Aldona Bar, MD  Cardiologist:  Shirlee More, MD    Referring MD: Aldona Bar, MD    ASSESSMENT:    No diagnosis found. PLAN:    In order of problems listed above:  ***   Next appointment: ***   Medication Adjustments/Labs and Tests Ordered: Current medicines are reviewed at length with the patient today.  Concerns regarding medicines are outlined above.  No orders of the defined types were placed in this encounter.  No orders of the defined types were placed in this encounter.   No chief complaint on file.   History of Present Illness:    Casey Ramos is a 66 y.o. female with a hx of CAD with ACS ST elevation MI PCI and stent of proximal mid and distal right coronary artery 12/05/2020 ischemic cardiomyopathy ejection fraction 30 to 35% COPD obstructive sleep apnea hyperlipidemia last seen 03/21/2021 her echocardiogram 01/28/2021 showed persistently reduced severe LV dysfunction on guideline directed therapy and subsequent ICD 03/14/2021. Compliance with diet, lifestyle and medications: ***  ICD download 06/17/2021 showed normal battery projected life 13 years and lead parameters and no episodes of ventricular tachycardia.  Her heart failure medications include low-dose loop diuretic Toprol XL 50 mg daily Entresto 97 103 twice daily spironolactone 25 mg/day and SGLT2 inhibitor for CHF 10 mg daily. She is on combined dual antiplatelet therapy aspirin and clopidogrel and lipid-lowering treatment with a high intensity statin For COPD she is on continuous low-flow oxygen 2 L/min Past Medical History:  Diagnosis Date   Asthma    Bartholin's gland abscess 11/13/2020   Benign neoplasm of skin or subcutaneous tissue 02/26/2016   Chronic toe pain, right foot 04/17/2019   Formatting of this note might be different from the original. First and second toes  right   Depression    Diabetes mellitus without complication ([place]) 11/25/5091   Dyslipidemia 02/12/2017   Essential hypertension 02/12/2017   GERD (gastroesophageal reflux disease) 11/13/2020   H/O: CVA (cerebrovascular accident) 02/12/2017   Hammer toes of both feet 07/29/2015   Hypercholesterolemia 11/13/2020   Inclusion cyst 03/18/2016   Ingrowing nail 07/29/2015   Migraine headache 11/13/2020   Other emphysema ([place]) 02/12/2017   PAD (peripheral artery disease) ([place]) 04/17/2019   Poorly controlled type 2 diabetes mellitus with peripheral neuropathy ([place]) 04/17/2019   RLS (restless legs syndrome) 11/13/2020    Past Surgical History:  Procedure Laterality Date   ABDOMINAL HYSTERECTOMY     CHOLECYSTECTOMY     FOOT SURGERY     ICD IMPLANT N/A 03/14/2021   Procedure: ICD IMPLANT;  Surgeon: Constance Haw, MD;  Location: [place] [facility];  Service: Cardiovascular;  Laterality: N/A;   KNEE SURGERY Right    SHOULDER SURGERY Left     Current Medications: No outpatient medications have been marked as taking for the 06/30/21 encounter (Appointment) with Richardo Priest, MD.     Allergies:   Ibuprofen, Morphine, Hydrocodone-acetaminophen, Other, Imitrex [sumatriptan], Naprosyn [naproxen], Pineapple, Chantix [varenicline], Codeine, and Tylenol [acetaminophen]   Social History   Socioeconomic History   Marital status: Widowed    Spouse name: Not on file   Number of children: Not on file   Years of education: Not on file   Highest education level: Not on file  Occupational History   Not on file  Tobacco Use   Smoking status: Every  Day    Packs/day: 2.00    Years: 60.00    Pack years: 120.00    Types: Cigarettes   Smokeless tobacco: Never  Vaping Use   Vaping Use: Never used  Substance and Sexual Activity   Alcohol use: Not Currently    Comment: Occasionally will have a 1 beer   Drug use: Yes    Types: Marijuana   Sexual activity: Not on file  Other Topics Concern   Not on file   Social History Narrative   Not on file   Social Determinants of Health   Financial Resource Strain: Not on file  Food Insecurity: Not on file  Transportation Needs: Not on file  Physical Activity: Not on file  Stress: Not on file  Social Connections: Not on file     Family History: The patient's ***family history includes Alcoholism in her brother; Diabetes in her brother, mother, and sister; Failure to thrive in her brother; Heart attack in her sister; Heart attack (age of onset: 65) in her mother; Hypertension in her brother, mother, and sister; Other in her father; Stroke in her brother. ROS:   Please see the history of present illness.    All other systems reviewed and are negative.  EKGs/Labs/Other Studies Reviewed:    The following studies were reviewed today:  EKG:  EKG ordered today and personally reviewed.  The ekg ordered today demonstrates ***  Recent Labs: 03/03/2021: Hemoglobin 15.6; Platelets 264 04/17/2021: ALT 11; BUN 14; Creatinine, Ser 0.53; Potassium 4.5; Sodium 138  Recent Lipid Panel    Component Value Date/Time   CHOL 271 (H) 03/21/2021 1038   TRIG 115 03/21/2021 1038   HDL 67 03/21/2021 1038   CHOLHDL 4.0 03/21/2021 1038   LDLCALC 184 (H) 03/21/2021 1038    Physical Exam:    VS:  LMP  (LMP Unknown)     Wt Readings from Last 3 Encounters:  06/16/21 155 lb (70.3 kg)  04/17/21 160 lb (72.6 kg)  03/21/21 158 lb (71.7 kg)     GEN: *** Well nourished, well developed in no acute distress HEENT: Normal NECK: No JVD; No carotid bruits LYMPHATICS: No lymphadenopathy CARDIAC: ***RRR, no murmurs, rubs, gallops RESPIRATORY:  Clear to auscultation without rales, wheezing or rhonchi  ABDOMEN: Soft, non-tender, non-distended MUSCULOSKELETAL:  No edema; No deformity  SKIN: Warm and dry NEUROLOGIC:  Alert and oriented x 3 PSYCHIATRIC:  Normal affect    Signed, Shirlee More, MD  06/29/2021 10:46 AM    [place]

## 2021-06-30 ENCOUNTER — Ambulatory Visit: Payer: Medicare HMO | Admitting: Cardiology

## 2021-09-16 ENCOUNTER — Ambulatory Visit (INDEPENDENT_AMBULATORY_CARE_PROVIDER_SITE_OTHER): Payer: Medicare HMO

## 2021-09-16 DIAGNOSIS — I255 Ischemic cardiomyopathy: Secondary | ICD-10-CM

## 2021-09-16 LAB — CUP PACEART REMOTE DEVICE CHECK
Battery Remaining Longevity: 134 mo
Battery Voltage: 3.06 V
Brady Statistic RV Percent Paced: 0 %
Date Time Interrogation Session: 20230411022604
HighPow Impedance: 74 Ohm
Implantable Lead Implant Date: 20221007
Implantable Lead Location: 753860
Implantable Pulse Generator Implant Date: 20221007
Lead Channel Impedance Value: 342 Ohm
Lead Channel Impedance Value: 456 Ohm
Lead Channel Pacing Threshold Amplitude: 0.625 V
Lead Channel Pacing Threshold Pulse Width: 0.4 ms
Lead Channel Sensing Intrinsic Amplitude: 7 mV
Lead Channel Sensing Intrinsic Amplitude: 7 mV
Lead Channel Setting Pacing Amplitude: 2 V
Lead Channel Setting Pacing Pulse Width: 0.4 ms
Lead Channel Setting Sensing Sensitivity: 0.3 mV

## 2021-09-29 NOTE — Progress Notes (Deleted)
Cardiology Office Note:    Date:  09/30/2021   ID:  Casey Ramos, DOB [birth year], MRN [id number]  PCP:  Aldona Bar, MD  Cardiologist:  Shirlee More, MD    Referring MD: Aldona Bar, MD    ASSESSMENT:    1. Coronary artery disease of native artery of native heart with stable angina pectoris ([place])   2. Chronic systolic (congestive) heart failure (HCC)   3. Cardiomyopathy, ischemic   4. Hypertensive heart disease, unspecified whether heart failure present   5. Hypercholesterolemia   6. ICD (implantable cardioverter-defibrillator) in place    PLAN:    In order of problems listed above:  ***   Next appointment: ***   Medication Adjustments/Labs and Tests Ordered: Current medicines are reviewed at length with the patient today.  Concerns regarding medicines are outlined above.  No orders of the defined types were placed in this encounter.  No orders of the defined types were placed in this encounter.   No chief complaint on file.   History of Present Illness:    Casey Ramos is a 66 y.o. female with a hx of CAD with ACS 12/05/2020 with ST elevation MI and PCI and stent to the proximal and distal right coronary artery cardiomyopathy ejection fraction 30 to 35% obstructive sleep apnea diabetes hyperlipidemia and previous substance abuse last seen 03/21/2021.  She was referred to EP and had an ICD implanted 03/14/2021 current medications include loop diuretic MRA beta-blocker high-dose Entresto and SGLT2 inhibitor for cardiomyopathy. Compliance with diet, lifestyle and medications: *** Past Medical History:  Diagnosis Date   Asthma    Bartholin's gland abscess 11/13/2020   Benign neoplasm of skin or subcutaneous tissue 02/26/2016   Chronic toe pain, right foot 04/17/2019   Formatting of this note might be different from the original. First and second toes right   Depression    Diabetes mellitus without complication ([place]) 11/19/4313   Dyslipidemia 02/12/2017    Essential hypertension 02/12/2017   GERD (gastroesophageal reflux disease) 11/13/2020   H/O: CVA (cerebrovascular accident) 02/12/2017   Hammer toes of both feet 07/29/2015   Hypercholesterolemia 11/13/2020   Inclusion cyst 03/18/2016   Ingrowing nail 07/29/2015   Migraine headache 11/13/2020   Other emphysema ([place]) 02/12/2017   PAD (peripheral artery disease) ([place]) 04/17/2019   Poorly controlled type 2 diabetes mellitus with peripheral neuropathy ([place]) 04/17/2019   RLS (restless legs syndrome) 11/13/2020    Past Surgical History:  Procedure Laterality Date   ABDOMINAL HYSTERECTOMY     CHOLECYSTECTOMY     FOOT SURGERY     ICD IMPLANT N/A 03/14/2021   Procedure: ICD IMPLANT;  Surgeon: Constance Haw, MD;  Location: [place] [facility];  Service: Cardiovascular;  Laterality: N/A;   KNEE SURGERY Right    SHOULDER SURGERY Left     Current Medications: No outpatient medications have been marked as taking for the 09/30/21 encounter (Appointment) with Richardo Priest, MD.     Allergies:   Ibuprofen, Morphine, Hydrocodone-acetaminophen, Other, Imitrex [sumatriptan], Naprosyn [naproxen], Pineapple, Chantix [varenicline], Codeine, and Tylenol [acetaminophen]   Social History   Socioeconomic History   Marital status: Widowed    Spouse name: Not on file   Number of children: Not on file   Years of education: Not on file   Highest education level: Not on file  Occupational History   Not on file  Tobacco Use   Smoking status: Every Day    Packs/day: 2.00    Years: 60.00  Pack years: 120.00    Types: Cigarettes   Smokeless tobacco: Never  Vaping Use   Vaping Use: Never used  Substance and Sexual Activity   Alcohol use: Not Currently    Comment: Occasionally will have a 1 beer   Drug use: Yes    Types: Marijuana   Sexual activity: Not on file  Other Topics Concern   Not on file  Social History Narrative   Not on file   Social Determinants of Health   Financial Resource Strain:  Not on file  Food Insecurity: Not on file  Transportation Needs: Not on file  Physical Activity: Not on file  Stress: Not on file  Social Connections: Not on file     Family History: The patient's ***family history includes Alcoholism in her brother; Diabetes in her brother, mother, and sister; Failure to thrive in her brother; Heart attack in her sister; Heart attack (age of onset: 70) in her mother; Hypertension in her brother, mother, and sister; Other in her father; Stroke in her brother. ROS:   Please see the history of present illness.    All other systems reviewed and are negative.  EKGs/Labs/Other Studies Reviewed:    The following studies were reviewed today:  EKG:  EKG ordered today and personally reviewed.  The ekg ordered today demonstrates ***  Recent Labs: 03/03/2021: Hemoglobin 15.6; Platelets 264 04/17/2021: ALT 11; BUN 14; Creatinine, Ser 0.53; Potassium 4.5; Sodium 138  Recent Lipid Panel    Component Value Date/Time   CHOL 271 (H) 03/21/2021 1038   TRIG 115 03/21/2021 1038   HDL 67 03/21/2021 1038   CHOLHDL 4.0 03/21/2021 1038   LDLCALC 184 (H) 03/21/2021 1038    Physical Exam:    VS:  LMP  (LMP Unknown)     Wt Readings from Last 3 Encounters:  06/16/21 155 lb (70.3 kg)  04/17/21 160 lb (72.6 kg)  03/21/21 158 lb (71.7 kg)     GEN: *** Well nourished, well developed in no acute distress HEENT: Normal NECK: No JVD; No carotid bruits LYMPHATICS: No lymphadenopathy CARDIAC: ***RRR, no murmurs, rubs, gallops RESPIRATORY:  Clear to auscultation without rales, wheezing or rhonchi  ABDOMEN: Soft, non-tender, non-distended MUSCULOSKELETAL:  No edema; No deformity  SKIN: Warm and dry NEUROLOGIC:  Alert and oriented x 3 PSYCHIATRIC:  Normal affect    Signed, Shirlee More, MD  09/30/2021 7:54 AM    [place]

## 2021-09-30 ENCOUNTER — Ambulatory Visit: Payer: Medicare HMO | Admitting: Cardiology

## 2021-09-30 DIAGNOSIS — I509 Heart failure, unspecified: Secondary | ICD-10-CM | POA: Diagnosis not present

## 2021-09-30 DIAGNOSIS — Z91199 Patient's noncompliance with other medical treatment and regimen due to unspecified reason: Secondary | ICD-10-CM

## 2021-09-30 DIAGNOSIS — F17200 Nicotine dependence, unspecified, uncomplicated: Secondary | ICD-10-CM | POA: Diagnosis not present

## 2021-09-30 DIAGNOSIS — J180 Bronchopneumonia, unspecified organism: Secondary | ICD-10-CM | POA: Diagnosis not present

## 2021-09-30 DIAGNOSIS — J441 Chronic obstructive pulmonary disease with (acute) exacerbation: Secondary | ICD-10-CM | POA: Diagnosis not present

## 2021-10-01 DIAGNOSIS — J180 Bronchopneumonia, unspecified organism: Secondary | ICD-10-CM | POA: Diagnosis not present

## 2021-10-01 DIAGNOSIS — I509 Heart failure, unspecified: Secondary | ICD-10-CM | POA: Diagnosis not present

## 2021-10-01 DIAGNOSIS — J441 Chronic obstructive pulmonary disease with (acute) exacerbation: Secondary | ICD-10-CM | POA: Diagnosis not present

## 2021-10-01 DIAGNOSIS — F17200 Nicotine dependence, unspecified, uncomplicated: Secondary | ICD-10-CM | POA: Diagnosis not present

## 2021-10-02 DIAGNOSIS — I509 Heart failure, unspecified: Secondary | ICD-10-CM | POA: Diagnosis not present

## 2021-10-02 DIAGNOSIS — J180 Bronchopneumonia, unspecified organism: Secondary | ICD-10-CM | POA: Diagnosis not present

## 2021-10-02 DIAGNOSIS — F17200 Nicotine dependence, unspecified, uncomplicated: Secondary | ICD-10-CM | POA: Diagnosis not present

## 2021-10-02 DIAGNOSIS — J441 Chronic obstructive pulmonary disease with (acute) exacerbation: Secondary | ICD-10-CM | POA: Diagnosis not present

## 2021-10-03 NOTE — Progress Notes (Signed)
Remote ICD transmission.   

## 2021-10-19 ENCOUNTER — Encounter: Payer: Self-pay | Admitting: Cardiology

## 2021-10-19 DIAGNOSIS — I361 Nonrheumatic tricuspid (valve) insufficiency: Secondary | ICD-10-CM | POA: Diagnosis not present

## 2021-10-19 DIAGNOSIS — I340 Nonrheumatic mitral (valve) insufficiency: Secondary | ICD-10-CM | POA: Diagnosis not present

## 2021-11-03 ENCOUNTER — Inpatient Hospital Stay (HOSPITAL_COMMUNITY)
Admission: EM | Admit: 2021-11-03 | Discharge: 2021-11-10 | DRG: 246 | Disposition: A | Discharge status: Skilled Nursing Facility | Payer: Medicare HMO | Attending: Family Medicine | Admitting: Family Medicine

## 2021-11-03 ENCOUNTER — Emergency Department (HOSPITAL_COMMUNITY): Payer: Medicare HMO

## 2021-11-03 ENCOUNTER — Other Ambulatory Visit: Payer: Self-pay

## 2021-11-03 DIAGNOSIS — E785 Hyperlipidemia, unspecified: Secondary | ICD-10-CM | POA: Diagnosis not present

## 2021-11-03 DIAGNOSIS — Z8673 Personal history of transient ischemic attack (TIA), and cerebral infarction without residual deficits: Secondary | ICD-10-CM | POA: Diagnosis not present

## 2021-11-03 DIAGNOSIS — I255 Ischemic cardiomyopathy: Secondary | ICD-10-CM | POA: Diagnosis present

## 2021-11-03 DIAGNOSIS — F419 Anxiety disorder, unspecified: Secondary | ICD-10-CM | POA: Diagnosis present

## 2021-11-03 DIAGNOSIS — I5042 Chronic combined systolic (congestive) and diastolic (congestive) heart failure: Secondary | ICD-10-CM

## 2021-11-03 DIAGNOSIS — I739 Peripheral vascular disease, unspecified: Secondary | ICD-10-CM | POA: Diagnosis present

## 2021-11-03 DIAGNOSIS — Z91018 Allergy to other foods: Secondary | ICD-10-CM

## 2021-11-03 DIAGNOSIS — G4733 Obstructive sleep apnea (adult) (pediatric): Secondary | ICD-10-CM | POA: Diagnosis present

## 2021-11-03 DIAGNOSIS — Z79899 Other long term (current) drug therapy: Secondary | ICD-10-CM

## 2021-11-03 DIAGNOSIS — I219 Acute myocardial infarction, unspecified: Secondary | ICD-10-CM

## 2021-11-03 DIAGNOSIS — Z8249 Family history of ischemic heart disease and other diseases of the circulatory system: Secondary | ICD-10-CM

## 2021-11-03 DIAGNOSIS — J9611 Chronic respiratory failure with hypoxia: Secondary | ICD-10-CM | POA: Diagnosis present

## 2021-11-03 DIAGNOSIS — I4891 Unspecified atrial fibrillation: Secondary | ICD-10-CM

## 2021-11-03 DIAGNOSIS — I5023 Acute on chronic systolic (congestive) heart failure: Secondary | ICD-10-CM

## 2021-11-03 DIAGNOSIS — G2581 Restless legs syndrome: Secondary | ICD-10-CM | POA: Diagnosis present

## 2021-11-03 DIAGNOSIS — Z66 Do not resuscitate: Secondary | ICD-10-CM | POA: Diagnosis present

## 2021-11-03 DIAGNOSIS — I509 Heart failure, unspecified: Secondary | ICD-10-CM

## 2021-11-03 DIAGNOSIS — I959 Hypotension, unspecified: Secondary | ICD-10-CM | POA: Diagnosis not present

## 2021-11-03 DIAGNOSIS — I214 Non-ST elevation (NSTEMI) myocardial infarction: Secondary | ICD-10-CM

## 2021-11-03 DIAGNOSIS — Z7902 Long term (current) use of antithrombotics/antiplatelets: Secondary | ICD-10-CM

## 2021-11-03 DIAGNOSIS — E0781 Sick-euthyroid syndrome: Secondary | ICD-10-CM | POA: Diagnosis present

## 2021-11-03 DIAGNOSIS — F17210 Nicotine dependence, cigarettes, uncomplicated: Secondary | ICD-10-CM | POA: Diagnosis present

## 2021-11-03 DIAGNOSIS — E876 Hypokalemia: Secondary | ICD-10-CM | POA: Diagnosis present

## 2021-11-03 DIAGNOSIS — Z95810 Presence of automatic (implantable) cardiac defibrillator: Secondary | ICD-10-CM

## 2021-11-03 DIAGNOSIS — R778 Other specified abnormalities of plasma proteins: Secondary | ICD-10-CM | POA: Diagnosis present

## 2021-11-03 DIAGNOSIS — E1165 Type 2 diabetes mellitus with hyperglycemia: Secondary | ICD-10-CM | POA: Diagnosis present

## 2021-11-03 DIAGNOSIS — I110 Hypertensive heart disease with heart failure: Secondary | ICD-10-CM | POA: Diagnosis present

## 2021-11-03 DIAGNOSIS — R079 Chest pain, unspecified: Secondary | ICD-10-CM | POA: Diagnosis not present

## 2021-11-03 DIAGNOSIS — Z9981 Dependence on supplemental oxygen: Secondary | ICD-10-CM | POA: Diagnosis not present

## 2021-11-03 DIAGNOSIS — D509 Iron deficiency anemia, unspecified: Secondary | ICD-10-CM | POA: Diagnosis present

## 2021-11-03 DIAGNOSIS — J439 Emphysema, unspecified: Secondary | ICD-10-CM | POA: Diagnosis present

## 2021-11-03 DIAGNOSIS — I2510 Atherosclerotic heart disease of native coronary artery without angina pectoris: Secondary | ICD-10-CM | POA: Diagnosis present

## 2021-11-03 DIAGNOSIS — E7800 Pure hypercholesterolemia, unspecified: Secondary | ICD-10-CM | POA: Diagnosis present

## 2021-11-03 DIAGNOSIS — I472 Ventricular tachycardia, unspecified: Secondary | ICD-10-CM

## 2021-11-03 DIAGNOSIS — Z8709 Personal history of other diseases of the respiratory system: Secondary | ICD-10-CM | POA: Diagnosis not present

## 2021-11-03 DIAGNOSIS — Z9049 Acquired absence of other specified parts of digestive tract: Secondary | ICD-10-CM

## 2021-11-03 DIAGNOSIS — I25110 Atherosclerotic heart disease of native coronary artery with unstable angina pectoris: Secondary | ICD-10-CM | POA: Diagnosis not present

## 2021-11-03 DIAGNOSIS — D649 Anemia, unspecified: Secondary | ICD-10-CM

## 2021-11-03 DIAGNOSIS — E1169 Type 2 diabetes mellitus with other specified complication: Secondary | ICD-10-CM | POA: Diagnosis present

## 2021-11-03 DIAGNOSIS — Z955 Presence of coronary angioplasty implant and graft: Secondary | ICD-10-CM

## 2021-11-03 DIAGNOSIS — Z885 Allergy status to narcotic agent status: Secondary | ICD-10-CM

## 2021-11-03 DIAGNOSIS — D72829 Elevated white blood cell count, unspecified: Secondary | ICD-10-CM

## 2021-11-03 DIAGNOSIS — E8809 Other disorders of plasma-protein metabolism, not elsewhere classified: Secondary | ICD-10-CM | POA: Diagnosis not present

## 2021-11-03 DIAGNOSIS — Z7982 Long term (current) use of aspirin: Secondary | ICD-10-CM

## 2021-11-03 DIAGNOSIS — Z833 Family history of diabetes mellitus: Secondary | ICD-10-CM

## 2021-11-03 DIAGNOSIS — Z888 Allergy status to other drugs, medicaments and biological substances status: Secondary | ICD-10-CM

## 2021-11-03 HISTORY — DX: Non-ST elevation (NSTEMI) myocardial infarction: I21.4

## 2021-11-03 HISTORY — DX: Iron deficiency anemia, unspecified: D50.9

## 2021-11-03 HISTORY — DX: Acute on chronic systolic (congestive) heart failure: I50.23

## 2021-11-03 HISTORY — DX: Hypokalemia: E87.6

## 2021-11-03 HISTORY — DX: Unspecified atrial fibrillation: I48.91

## 2021-11-03 LAB — VITAMIN B12: Vitamin B-12: 430 pg/mL (ref 180–914)

## 2021-11-03 LAB — COMPREHENSIVE METABOLIC PANEL
ALT: 17 U/L (ref 0–44)
AST: 13 U/L — ABNORMAL LOW (ref 15–41)
Albumin: 3.3 g/dL — ABNORMAL LOW (ref 3.5–5.0)
Alkaline Phosphatase: 77 U/L (ref 38–126)
Anion gap: 14 (ref 5–15)
BUN: 12 mg/dL (ref 8–23)
CO2: 24 mmol/L (ref 22–32)
Calcium: 8.7 mg/dL — ABNORMAL LOW (ref 8.9–10.3)
Chloride: 103 mmol/L (ref 98–111)
Creatinine, Ser: 0.54 mg/dL (ref 0.44–1.00)
GFR, Estimated: >60 mL/min (ref >60)
Glucose, Bld: 259 mg/dL — ABNORMAL HIGH (ref 70–99)
Potassium: 2.9 mmol/L — ABNORMAL LOW (ref 3.5–5.1)
Sodium: 141 mmol/L (ref 135–145)
Total Bilirubin: 0.6 mg/dL (ref 0.3–1.2)
Total Protein: 6 g/dL — ABNORMAL LOW (ref 6.5–8.1)

## 2021-11-03 LAB — CBC WITH DIFFERENTIAL/PLATELET
Abs Immature Granulocytes: 0.1 10*3/uL — ABNORMAL HIGH (ref 0.00–0.07)
Basophils Absolute: 0 10*3/uL (ref 0.0–0.1)
Basophils Relative: 0 %
Eosinophils Absolute: 0.1 10*3/uL (ref 0.0–0.5)
Eosinophils Relative: 1 %
HCT: 34.9 % — ABNORMAL LOW (ref 36.0–46.0)
Hemoglobin: 11.5 g/dL — ABNORMAL LOW (ref 12.0–15.0)
Immature Granulocytes: 1 %
Lymphocytes Relative: 7 %
Lymphs Abs: 1.1 10*3/uL (ref 0.7–4.0)
MCH: 29.6 pg (ref 26.0–34.0)
MCHC: 33 g/dL (ref 30.0–36.0)
MCV: 89.9 fL (ref 80.0–100.0)
Monocytes Absolute: 0.9 10*3/uL (ref 0.1–1.0)
Monocytes Relative: 6 %
Neutro Abs: 13.9 10*3/uL — ABNORMAL HIGH (ref 1.7–7.7)
Neutrophils Relative %: 85 %
Platelets: 406 10*3/uL — ABNORMAL HIGH (ref 150–400)
RBC: 3.88 MIL/uL (ref 3.87–5.11)
RDW: 13.8 % (ref 11.5–15.5)
WBC: 16.1 10*3/uL — ABNORMAL HIGH (ref 4.0–10.5)
nRBC: 0 % (ref 0.0–0.2)

## 2021-11-03 LAB — PROTIME-INR
INR: 1.3 — ABNORMAL HIGH (ref 0.8–1.2)
Prothrombin Time: 16 seconds — ABNORMAL HIGH (ref 11.4–15.2)

## 2021-11-03 LAB — RETICULOCYTES
Immature Retic Fract: 11.1 % (ref 2.3–15.9)
RBC.: 3.48 MIL/uL — ABNORMAL LOW (ref 3.87–5.11)
Retic Count, Absolute: 92.9 10*3/uL (ref 19.0–186.0)
Retic Ct Pct: 2.7 % (ref 0.4–3.1)

## 2021-11-03 LAB — IRON AND TIBC
Iron: 22 ug/dL — ABNORMAL LOW (ref 28–170)
Saturation Ratios: 7 % — ABNORMAL LOW (ref 10.4–31.8)
TIBC: 298 ug/dL (ref 250–450)
UIBC: 276 ug/dL

## 2021-11-03 LAB — MAGNESIUM: Magnesium: 1.8 mg/dL (ref 1.7–2.4)

## 2021-11-03 LAB — FERRITIN: Ferritin: 174 ng/mL (ref 11–307)

## 2021-11-03 LAB — GLUCOSE, RANDOM: Glucose, Bld: 404 mg/dL — ABNORMAL HIGH (ref 70–99)

## 2021-11-03 LAB — BRAIN NATRIURETIC PEPTIDE: B Natriuretic Peptide: 717.8 pg/mL — ABNORMAL HIGH (ref 0.0–100.0)

## 2021-11-03 LAB — GLUCOSE, CAPILLARY: Glucose-Capillary: 410 mg/dL — ABNORMAL HIGH (ref 70–99)

## 2021-11-03 LAB — HEMOGLOBIN A1C
Hgb A1c MFr Bld: 8.8 % — ABNORMAL HIGH (ref 4.8–5.6)
Mean Plasma Glucose: 205.86 mg/dL

## 2021-11-03 LAB — D-DIMER, QUANTITATIVE: D-Dimer, Quant: 0.49 ug/mL-FEU (ref 0.00–0.50)

## 2021-11-03 LAB — TROPONIN I (HIGH SENSITIVITY)
Troponin I (High Sensitivity): 373 ng/L (ref <18)
Troponin I (High Sensitivity): 883 ng/L (ref <18)
Troponin I (High Sensitivity): 2073 ng/L (ref <18)

## 2021-11-03 LAB — FOLATE: Folate: 13.2 ng/mL (ref >5.9)

## 2021-11-03 LAB — TSH: TSH: 0.215 u[IU]/mL — ABNORMAL LOW (ref 0.350–4.500)

## 2021-11-03 MED ORDER — POTASSIUM CHLORIDE CRYS ER 20 MEQ PO TBCR
40.0000 meq | EXTENDED_RELEASE_TABLET | ORAL | Status: AC
  Administered 2021-11-03 – 2021-11-04 (×2): 40 meq via ORAL
  Filled 2021-11-03 (×2): qty 2

## 2021-11-03 MED ORDER — INSULIN ASPART 100 UNIT/ML IJ SOLN
0.0000 [IU] | Freq: Three times a day (TID) | INTRAMUSCULAR | Status: DC
  Administered 2021-11-04: 5 [IU] via SUBCUTANEOUS
  Administered 2021-11-04: 3 [IU] via SUBCUTANEOUS
  Administered 2021-11-04: 2 [IU] via SUBCUTANEOUS
  Administered 2021-11-05 – 2021-11-06 (×4): 3 [IU] via SUBCUTANEOUS
  Administered 2021-11-07 (×2): 2 [IU] via SUBCUTANEOUS
  Administered 2021-11-08: 1 [IU] via SUBCUTANEOUS
  Administered 2021-11-08: 3 [IU] via SUBCUTANEOUS
  Administered 2021-11-09: 1 [IU] via SUBCUTANEOUS
  Administered 2021-11-09 (×2): 2 [IU] via SUBCUTANEOUS
  Administered 2021-11-10: 1 [IU] via SUBCUTANEOUS

## 2021-11-03 MED ORDER — INSULIN ASPART 100 UNIT/ML IJ SOLN
0.0000 [IU] | Freq: Every day | INTRAMUSCULAR | Status: DC
  Administered 2021-11-04: 2 [IU] via SUBCUTANEOUS

## 2021-11-03 MED ORDER — HEPARIN (PORCINE) 25000 UT/250ML-% IV SOLN
1250.0000 [IU]/h | INTRAVENOUS | Status: DC
  Administered 2021-11-03: 850 [IU]/h via INTRAVENOUS
  Administered 2021-11-04: 1050 [IU]/h via INTRAVENOUS
  Filled 2021-11-03 (×2): qty 250

## 2021-11-03 MED ORDER — DILTIAZEM HCL-DEXTROSE 125-5 MG/125ML-% IV SOLN (PREMIX)
5.0000 mg/h | INTRAVENOUS | Status: DC
  Administered 2021-11-03: 5 mg/h via INTRAVENOUS
  Administered 2021-11-03: 15 mg/h via INTRAVENOUS
  Filled 2021-11-03 (×2): qty 125

## 2021-11-03 MED ORDER — INSULIN ASPART 100 UNIT/ML IJ SOLN
6.0000 [IU] | Freq: Once | INTRAMUSCULAR | Status: AC
  Administered 2021-11-04: 6 [IU] via SUBCUTANEOUS

## 2021-11-03 MED ORDER — DILTIAZEM LOAD VIA INFUSION
15.0000 mg | Freq: Once | INTRAVENOUS | Status: AC
  Administered 2021-11-03: 15 mg via INTRAVENOUS
  Filled 2021-11-03: qty 15

## 2021-11-03 MED ORDER — ASPIRIN 81 MG PO CHEW
324.0000 mg | CHEWABLE_TABLET | Freq: Once | ORAL | Status: AC
  Administered 2021-11-03: 324 mg via ORAL
  Filled 2021-11-03: qty 4

## 2021-11-03 MED ORDER — LORAZEPAM 1 MG PO TABS
0.5000 mg | ORAL_TABLET | Freq: Once | ORAL | Status: AC
  Administered 2021-11-03: 0.5 mg via ORAL
  Filled 2021-11-03: qty 1

## 2021-11-03 MED ORDER — HEPARIN BOLUS VIA INFUSION
4000.0000 [IU] | Freq: Once | INTRAVENOUS | Status: AC
  Administered 2021-11-03: 4000 [IU] via INTRAVENOUS
  Filled 2021-11-03: qty 4000

## 2021-11-03 NOTE — ED Notes (Signed)
IV attempted x 3 by medic and RN; iv team consulted

## 2021-11-03 NOTE — ED Notes (Signed)
Pt c/o worsening sob, lung sounds are clear, baseline home oxygen is 2-3L, currently on 4L [place].

## 2021-11-03 NOTE — Progress Notes (Signed)
ANTICOAGULATION CONSULT NOTE - Initial Consult  Pharmacy Consult for Heparin Indication: chest pain/ACS  Allergies  Allergen Reactions   Ibuprofen Anaphylaxis    Gave her a heart attack   Morphine Anaphylaxis    Ask patient   Hydrocodone-Acetaminophen Nausea And Vomiting and Rash   Other Other (See Comments)    Ask Patient - pt doesn't remember   Imitrex [Sumatriptan] Other (See Comments)    Shock   Naprosyn [Naproxen] Hives   Pineapple Hives   Chantix [Varenicline] Rash   Codeine Nausea And Vomiting    Ask patient    Tylenol [Acetaminophen] Rash    Patient Measurements: Height: '5\' 1"'$  (154.9 cm) Weight: 70 kg (154 lb 5.2 oz) IBW/kg (Calculated) : 47.8 Heparin Dosing Weight: 62.8 kg  Vital Signs: Temp: 97.8 F (36.6 C) (05/29 1641) Temp Source: Temporal (05/29 1641) BP: 103/65 (05/29 1942) Pulse Rate: 56 (05/29 1942)  Labs: Recent Labs    11/03/21 0640 11/03/21 1831  HGB 11.5*  --   HCT 34.9*  --   PLT 406*  --   CREATININE 0.54  --   TROPONINIHS 373* 883*    Estimated Creatinine Clearance: 61.1 mL/min (by C-G formula based on SCr of 0.54 mg/dL).   Medical History: Past Medical History:  Diagnosis Date   Asthma    Bartholin's gland abscess 11/13/2020   Benign neoplasm of skin or subcutaneous tissue 02/26/2016   Chronic toe pain, right foot 04/17/2019   Formatting of this note might be different from the original. First and second toes right   Depression    Diabetes mellitus without complication ([place]) 2/80/0349   Dyslipidemia 02/12/2017   Essential hypertension 02/12/2017   GERD (gastroesophageal reflux disease) 11/13/2020   H/O: CVA (cerebrovascular accident) 02/12/2017   Hammer toes of both feet 07/29/2015   Hypercholesterolemia 11/13/2020   Inclusion cyst 03/18/2016   Ingrowing nail 07/29/2015   Migraine headache 11/13/2020   Other emphysema ([place]) 02/12/2017   PAD (peripheral artery disease) ([place]) 04/17/2019   Poorly controlled type 2 diabetes mellitus with  peripheral neuropathy ([place]) 04/17/2019   RLS (restless legs syndrome) 11/13/2020    Medications:  (Not in a hospital admission)  Scheduled:  Infusions:   diltiazem (CARDIZEM) infusion 5 mg/hr (11/03/21 1651)   PRN:   Assessment: 78 yof with a history of HFrEF s/p ICD, CAD s/p PCI, VT, CVA, HLD, DM, PAD, COPD, OSA, substance abuse. Patient is presenting with SOB and tachycardia. Heparin per pharmacy consult placed for chest pain/ACS.  Patient is not on anticoagulation prior to arrival.  Hgb 11.5; plt 406  Goal of Therapy:  Heparin level 0.3-0.7 units/ml Monitor platelets by anticoagulation protocol: Yes   Plan:  Give 4000 units bolus x 1 Start heparin infusion at 850 units/hr Check anti-Xa level at 0400 and daily while on heparin Continue to monitor H&H and platelets  Lorelei Pont, PharmD, BCPS 11/03/2021 7:48 PM ED Clinical Pharmacist -  [phone]

## 2021-11-03 NOTE — ED Notes (Signed)
IV team at bedside 
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
## 2021-11-03 NOTE — ED Notes (Signed)
Admitting dr at bedside.  

## 2021-11-03 NOTE — ED Notes (Addendum)
06/16/21 the machine shows that it has not discharged.  18 episodes of AF detected with RVR.  3 VT on 10/31/21 over 800 non-sustained VT.  Opticol shows positive which indicates heart failure.  Mickel Baas with [facility] will send detailed report.

## 2021-11-03 NOTE — ED Notes (Signed)
Critical result Troponin 373ng.  MD notified.  No further orders at this time.
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
## 2021-11-03 NOTE — Progress Notes (Signed)
Pt. Refused ° °

## 2021-11-03 NOTE — ED Notes (Signed)
Pt still showing Afib RVR rates in the 120s-150 and maxed out on cardizem drip at 15. Per Dr. Royce Macadamia will continue  to monitor her and no new orders to be placed at this time.
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
## 2021-11-03 NOTE — ED Notes (Signed)
Lab called notified pts trop was [phone]. Could not find dr and phone number not working. So a message was sent.

## 2021-11-03 NOTE — ED Notes (Signed)
Pt was repositioned and made comfortable. No complaints at this time

## 2021-11-03 NOTE — ED Notes (Addendum)
Critical result Troponin 373ng.  MD notified.  No further orders at this time.
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
## 2021-11-04 ENCOUNTER — Encounter (HOSPITAL_COMMUNITY): Payer: Self-pay | Admitting: Internal Medicine

## 2021-11-04 ENCOUNTER — Inpatient Hospital Stay (HOSPITAL_COMMUNITY): Payer: Medicare HMO

## 2021-11-04 DIAGNOSIS — I219 Acute myocardial infarction, unspecified: Secondary | ICD-10-CM

## 2021-11-04 DIAGNOSIS — I5042 Chronic combined systolic (congestive) and diastolic (congestive) heart failure: Secondary | ICD-10-CM | POA: Diagnosis not present

## 2021-11-04 DIAGNOSIS — I25110 Atherosclerotic heart disease of native coronary artery with unstable angina pectoris: Secondary | ICD-10-CM

## 2021-11-04 DIAGNOSIS — I4891 Unspecified atrial fibrillation: Secondary | ICD-10-CM | POA: Diagnosis not present

## 2021-11-04 LAB — HEPARIN LEVEL (UNFRACTIONATED)
Heparin Unfractionated: 0.12 IU/mL — ABNORMAL LOW (ref 0.30–0.70)
Heparin Unfractionated: 0.5 IU/mL (ref 0.30–0.70)

## 2021-11-04 LAB — RAPID URINE DRUG SCREEN, HOSP PERFORMED
Amphetamines: NOT DETECTED
Barbiturates: NOT DETECTED
Benzodiazepines: NOT DETECTED
Cocaine: NOT DETECTED
Opiates: NOT DETECTED
Tetrahydrocannabinol: NOT DETECTED

## 2021-11-04 LAB — URINALYSIS, ROUTINE W REFLEX MICROSCOPIC
Bacteria, UA: NONE SEEN
Bilirubin Urine: NEGATIVE
Glucose, UA: >=500 mg/dL — AB
Ketones, ur: 20 mg/dL — AB
Leukocytes,Ua: NEGATIVE
Nitrite: NEGATIVE
Protein, ur: NEGATIVE mg/dL
Specific Gravity, Urine: 1.027 (ref 1.005–1.030)
pH: 6 (ref 5.0–8.0)

## 2021-11-04 LAB — CBC
HCT: 29 % — ABNORMAL LOW (ref 36.0–46.0)
Hemoglobin: 9.9 g/dL — ABNORMAL LOW (ref 12.0–15.0)
MCH: 30.4 pg (ref 26.0–34.0)
MCHC: 34.1 g/dL (ref 30.0–36.0)
MCV: 89 fL (ref 80.0–100.0)
Platelets: 313 10*3/uL (ref 150–400)
RBC: 3.26 MIL/uL — ABNORMAL LOW (ref 3.87–5.11)
RDW: 14 % (ref 11.5–15.5)
WBC: 7.8 10*3/uL (ref 4.0–10.5)
nRBC: 0 % (ref 0.0–0.2)

## 2021-11-04 LAB — GLUCOSE, CAPILLARY
Glucose-Capillary: 229 mg/dL — ABNORMAL HIGH (ref 70–99)
Glucose-Capillary: 261 mg/dL — ABNORMAL HIGH (ref 70–99)
Glucose-Capillary: 189 mg/dL — ABNORMAL HIGH (ref 70–99)
Glucose-Capillary: 208 mg/dL — ABNORMAL HIGH (ref 70–99)

## 2021-11-04 LAB — BASIC METABOLIC PANEL
Anion gap: 9 (ref 5–15)
BUN: 13 mg/dL (ref 8–23)
CO2: 25 mmol/L (ref 22–32)
Calcium: 8.7 mg/dL — ABNORMAL LOW (ref 8.9–10.3)
Chloride: 107 mmol/L (ref 98–111)
Creatinine, Ser: 0.45 mg/dL (ref 0.44–1.00)
GFR, Estimated: >60 mL/min (ref >60)
Glucose, Bld: 231 mg/dL — ABNORMAL HIGH (ref 70–99)
Potassium: 4 mmol/L (ref 3.5–5.1)
Sodium: 141 mmol/L (ref 135–145)

## 2021-11-04 LAB — HIV ANTIBODY (ROUTINE TESTING W REFLEX): HIV Screen 4th Generation wRfx: NONREACTIVE

## 2021-11-04 MED ORDER — SODIUM CHLORIDE 0.9% FLUSH: 3.0000 mL | INTRAVENOUS | Status: DC | PRN

## 2021-11-04 MED ORDER — INSULIN ASPART 100 UNIT/ML IJ SOLN
3.0000 [IU] | Freq: Three times a day (TID) | INTRAMUSCULAR | Status: DC
  Administered 2021-11-04 – 2021-11-09 (×13): 3 [IU] via SUBCUTANEOUS

## 2021-11-04 MED ORDER — ASPIRIN 81 MG PO CHEW
81.0000 mg | CHEWABLE_TABLET | ORAL | Status: AC
  Administered 2021-11-05: 81 mg via ORAL
  Filled 2021-11-04: qty 1

## 2021-11-04 MED ORDER — EZETIMIBE 10 MG PO TABS
10.0000 mg | ORAL_TABLET | Freq: Every day | ORAL | Status: DC
  Administered 2021-11-04 – 2021-11-10 (×7): 10 mg via ORAL
  Filled 2021-11-04 (×7): qty 1

## 2021-11-04 MED ORDER — SODIUM CHLORIDE 0.9 % WEIGHT BASED INFUSION
3.0000 mL/kg/h | INTRAVENOUS | Status: DC
  Administered 2021-11-05: 3 mL/kg/h via INTRAVENOUS

## 2021-11-04 MED ORDER — SODIUM CHLORIDE 0.9% FLUSH
3.0000 mL | Freq: Two times a day (BID) | INTRAVENOUS | Status: DC
  Administered 2021-11-04 – 2021-11-06 (×6): 3 mL via INTRAVENOUS

## 2021-11-04 MED ORDER — SODIUM CHLORIDE 0.9 % IV SOLN: 250.0000 mL | INTRAVENOUS | Status: DC | PRN

## 2021-11-04 MED ORDER — POTASSIUM CHLORIDE CRYS ER 20 MEQ PO TBCR
20.0000 meq | EXTENDED_RELEASE_TABLET | Freq: Once | ORAL | Status: AC
  Administered 2021-11-04: 20 meq via ORAL
  Filled 2021-11-04: qty 1

## 2021-11-04 MED ORDER — IPRATROPIUM-ALBUTEROL 0.5-2.5 (3) MG/3ML IN SOLN
3.0000 mL | Freq: Four times a day (QID) | RESPIRATORY_TRACT | Status: DC
  Administered 2021-11-04: 3 mL via RESPIRATORY_TRACT
  Filled 2021-11-04: qty 3

## 2021-11-04 MED ORDER — INSULIN GLARGINE-YFGN 100 UNIT/ML ~~LOC~~ SOLN
20.0000 [IU] | Freq: Every day | SUBCUTANEOUS | Status: DC
  Administered 2021-11-04 – 2021-11-10 (×7): 20 [IU] via SUBCUTANEOUS
  Filled 2021-11-04 (×7): qty 0.2

## 2021-11-04 MED ORDER — SODIUM CHLORIDE 0.9 % IV SOLN
250.0000 mg | Freq: Every day | INTRAVENOUS | Status: AC
  Administered 2021-11-04 – 2021-11-05 (×2): 250 mg via INTRAVENOUS
  Filled 2021-11-04 (×2): qty 20

## 2021-11-04 MED ORDER — HEPARIN BOLUS VIA INFUSION
2000.0000 [IU] | Freq: Once | INTRAVENOUS | Status: AC
  Administered 2021-11-04: 2000 [IU] via INTRAVENOUS
  Filled 2021-11-04: qty 2000

## 2021-11-04 MED ORDER — ATORVASTATIN CALCIUM 80 MG PO TABS
80.0000 mg | ORAL_TABLET | Freq: Every day | ORAL | Status: DC
  Administered 2021-11-04 – 2021-11-10 (×7): 80 mg via ORAL
  Filled 2021-11-04 (×7): qty 1

## 2021-11-04 MED ORDER — METOPROLOL SUCCINATE ER 50 MG PO TB24
50.0000 mg | ORAL_TABLET | Freq: Every day | ORAL | Status: DC
  Administered 2021-11-04 – 2021-11-05 (×2): 50 mg via ORAL
  Filled 2021-11-04 (×2): qty 1

## 2021-11-04 MED ORDER — FUROSEMIDE 10 MG/ML IJ SOLN
40.0000 mg | INTRAMUSCULAR | Status: AC
  Administered 2021-11-04: 40 mg via INTRAVENOUS

## 2021-11-04 MED ORDER — SACUBITRIL-VALSARTAN 49-51 MG PO TABS
1.0000 | ORAL_TABLET | Freq: Two times a day (BID) | ORAL | Status: DC
  Administered 2021-11-04 – 2021-11-08 (×9): 1 via ORAL
  Filled 2021-11-04 (×10): qty 1

## 2021-11-04 MED ORDER — ALPRAZOLAM 0.5 MG PO TABS
0.5000 mg | ORAL_TABLET | Freq: Two times a day (BID) | ORAL | Status: DC | PRN
Start: 2021-11-04 — End: 2021-11-09
  Administered 2021-11-04 – 2021-11-09 (×13): 0.5 mg via ORAL
  Filled 2021-11-04 (×14): qty 1

## 2021-11-04 MED ORDER — CLOPIDOGREL BISULFATE 75 MG PO TABS
75.0000 mg | ORAL_TABLET | Freq: Every day | ORAL | Status: DC
  Administered 2021-11-04 – 2021-11-10 (×7): 75 mg via ORAL
  Filled 2021-11-04 (×8): qty 1

## 2021-11-04 MED ORDER — FUROSEMIDE 10 MG/ML IJ SOLN
10.0000 mg | Freq: Once | INTRAMUSCULAR | Status: AC
Start: 2021-11-04 — End: 2021-11-04
  Administered 2021-11-04: 10 mg via INTRAVENOUS
  Filled 2021-11-04: qty 2

## 2021-11-04 MED ORDER — FUROSEMIDE 10 MG/ML IJ SOLN
40.0000 mg | Freq: Once | INTRAMUSCULAR | Status: DC
  Filled 2021-11-04: qty 4

## 2021-11-04 MED ORDER — IPRATROPIUM-ALBUTEROL 0.5-2.5 (3) MG/3ML IN SOLN
3.0000 mL | Freq: Three times a day (TID) | RESPIRATORY_TRACT | Status: DC
  Administered 2021-11-05 – 2021-11-06 (×3): 3 mL via RESPIRATORY_TRACT
  Filled 2021-11-04 (×3): qty 3

## 2021-11-04 MED ORDER — LEVALBUTEROL HCL 0.63 MG/3ML IN NEBU
0.6300 mg | INHALATION_SOLUTION | Freq: Four times a day (QID) | RESPIRATORY_TRACT | Status: DC | PRN
  Administered 2021-11-04 – 2021-11-10 (×11): 0.63 mg via RESPIRATORY_TRACT
  Filled 2021-11-04 (×11): qty 3

## 2021-11-04 MED ORDER — OXYCODONE HCL 5 MG PO TABS
5.0000 mg | ORAL_TABLET | Freq: Four times a day (QID) | ORAL | Status: DC | PRN
  Administered 2021-11-04 – 2021-11-10 (×21): 5 mg via ORAL
  Filled 2021-11-04 (×21): qty 1

## 2021-11-04 MED ORDER — IPRATROPIUM-ALBUTEROL 0.5-2.5 (3) MG/3ML IN SOLN
3.0000 mL | Freq: Four times a day (QID) | RESPIRATORY_TRACT | Status: DC
Start: 2021-11-04 — End: 2021-11-04
  Administered 2021-11-04 (×2): 3 mL via RESPIRATORY_TRACT
  Filled 2021-11-04 (×2): qty 3
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
## 2021-11-04 NOTE — Progress Notes (Signed)
Patient declined CPAP. No unit in room at this time. °

## 2021-11-04 NOTE — Progress Notes (Signed)
ANTICOAGULATION CONSULT NOTE - Follow Up Consult  Pharmacy Consult for heparin Indication: atrial fibrillation/ACS  Labs: Recent Labs    11/03/21 0640 11/03/21 1831 11/03/21 2112 11/03/21 2230 11/04/21 0504 11/04/21 1302  HGB 11.5*  --   --   --  9.9*  --   HCT 34.9*  --   --   --  29.0*  --   PLT 406*  --   --   --  313  --   LABPROT  --   --  16.0*  --   --   --   INR  --   --  1.3*  --   --   --   HEPARINUNFRC  --   --   --   --  0.12* 0.50  CREATININE 0.54  --   --   --  0.45  --   TROPONINIHS 373* 883*  --  2,073*  --   --      Assessment: 66yo female started on heparin for new Afib and chest pain Heparin drip rate 1050 uts/hr heparin level 0.5 at goal  No infusion issues or signs of bleeding, cbc stable   Goal of Therapy:  Heparin level 0.3-0.7 units/ml   Plan:  Heparin drip 150 uts/hr  Daily heparin level cbc  Monitor s/s bleeding    Bonnita Nasuti Pharm.D. CPP, BCPS Clinical Pharmacist [phone] 11/04/2021 2:24 PM

## 2021-11-04 NOTE — Progress Notes (Signed)
Heart Failure Nurse Navigator Progress Note  Echo- 30-35%  R/L [facility] 5/31  Navigation team following this hospitalization. Screening pending further testing/cardiac workup.   Kevan Rosebush, RN, BSN, [facility] Heart Failure Navigator Heart & Vascular Care Navigation Team

## 2021-11-04 NOTE — Progress Notes (Addendum)
   Pt developed acute SOB - on exam she has wheezes and rales.  HR 90s to 110 irreg irreg. Will give IV lasix.  Also since she remains in a fib will discuss with Dr. Sallyanne Kuster if we need to hold IV dilt and use amiodarone, though she does have significant COPD.  EKG with atrial fib and T wave inversions in lateral leads.   Will continue dilt until post cath.

## 2021-11-04 NOTE — Progress Notes (Signed)
Mobility Specialist Progress Note    11/04/21 1152  Mobility  Activity Ambulated with assistance in room  Level of Assistance Minimal assist, patient does 75% or more  Assistive Device Front wheel walker  Distance Ambulated (ft) 20 ft  Activity Response Tolerated well  $Mobility charge 1 Mobility    Pre-Mobility: 104 HR, 98% SpO2 During Mobility: 97% SpO2 Post-Mobility: 97 HR, 100% SpO2  Pt received sitting EOB and agreeable. No complaints. Ambulated on 3LO2. Pt SOB with exertion. Left sitting EOB with call bell in reach. RN notified.   Hildred Alamin Mobility Specialist  Primary: 5N M.S. Phone: [phone] Secondary: 6N M.S. Phone: [phone]

## 2021-11-04 NOTE — Progress Notes (Signed)
   11/03/21 2148  Assess: MEWS Score  Temp 98.1 F (36.7 C)  BP 128/83  Pulse Rate (!) 113  ECG Heart Rate (!) 112  Resp (!) 23  Level of Consciousness Alert  SpO2 97 %  O2 Device Nasal Cannula  O2 Flow Rate (L/min) 4 L/min  Assess: MEWS Score  MEWS Temp 0  MEWS Systolic 0  MEWS Pulse 2  MEWS RR 1  MEWS LOC 0  MEWS Score 3  MEWS Score Color Yellow  Assess: if the MEWS score is Yellow or Red  Were vital signs taken at a resting state? Yes  Focused Assessment No change from prior assessment  Early Detection of Sepsis Score *See Row Information* High  MEWS guidelines implemented *See Row Information* Yes  Treat  MEWS Interventions Administered scheduled meds/treatments;Escalated (See documentation below)  Pain Scale 0-10  Pain Score 0  Patients Stated Pain Goal 0  Take Vital Signs  Increase Vital Sign Frequency  Yellow: Q 2hr X 2 then Q 4hr X 2, if remains yellow, continue Q 4hrs  Escalate  MEWS: Escalate Yellow: discuss with charge nurse/RN and consider discussing with provider and RRT  Notify: Charge Nurse/RN  Name of Charge Nurse/RN Notified Chanda Busing, RN  Date Charge Nurse/RN Notified 11/03/21  Time Charge Nurse/RN Notified 2200  Document  Patient Outcome Stabilized after interventions  Progress note created (see row info) Yes

## 2021-11-04 NOTE — Plan of Care (Signed)
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
## 2021-11-04 NOTE — Progress Notes (Addendum)
ANTICOAGULATION CONSULT NOTE - Follow Up Consult  Pharmacy Consult for heparin Indication: atrial fibrillation  Labs: Recent Labs    11/03/21 0640 11/03/21 1831 11/03/21 2112 11/03/21 2230 11/04/21 0504  HGB 11.5*  --   --   --  9.9*  HCT 34.9*  --   --   --  29.0*  PLT 406*  --   --   --  313  LABPROT  --   --  16.0*  --   --   INR  --   --  1.3*  --   --   HEPARINUNFRC  --   --   --   --  0.12*  CREATININE 0.54  --   --   --  0.45  TROPONINIHS 373* 883*  --  2,073*  --     Assessment: 66yo female subtherapeutic on heparin with initial dosing for Afib; no infusion issues or signs of bleeding per RN.  Goal of Therapy:  Heparin level 0.3-0.7 units/ml   Plan:  Will rebolus with heparin 2000 units and increase heparin infusion by 3 units/kg/hr to 1050 units/hr and check level in 6-8 hours.    Wynona Neat, PharmD, BCPS  11/04/2021,6:12 AM
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
## 2021-11-05 ENCOUNTER — Encounter (HOSPITAL_COMMUNITY)
Admission: EM | Disposition: A | Discharge status: Skilled Nursing Facility | Payer: Self-pay | Source: Home / Self Care | Attending: Internal Medicine

## 2021-11-05 ENCOUNTER — Other Ambulatory Visit (HOSPITAL_COMMUNITY): Payer: Self-pay

## 2021-11-05 DIAGNOSIS — I2510 Atherosclerotic heart disease of native coronary artery without angina pectoris: Secondary | ICD-10-CM

## 2021-11-05 DIAGNOSIS — E1169 Type 2 diabetes mellitus with other specified complication: Secondary | ICD-10-CM | POA: Diagnosis present

## 2021-11-05 DIAGNOSIS — I214 Non-ST elevation (NSTEMI) myocardial infarction: Secondary | ICD-10-CM | POA: Diagnosis not present

## 2021-11-05 DIAGNOSIS — E876 Hypokalemia: Secondary | ICD-10-CM

## 2021-11-05 DIAGNOSIS — Z8709 Personal history of other diseases of the respiratory system: Secondary | ICD-10-CM

## 2021-11-05 DIAGNOSIS — I5023 Acute on chronic systolic (congestive) heart failure: Secondary | ICD-10-CM | POA: Diagnosis not present

## 2021-11-05 DIAGNOSIS — E785 Hyperlipidemia, unspecified: Secondary | ICD-10-CM

## 2021-11-05 DIAGNOSIS — I4891 Unspecified atrial fibrillation: Secondary | ICD-10-CM | POA: Diagnosis not present

## 2021-11-05 HISTORY — DX: Type 2 diabetes mellitus with other specified complication: E11.69

## 2021-11-05 HISTORY — DX: Personal history of other diseases of the respiratory system: Z87.09

## 2021-11-05 HISTORY — PX: LEFT HEART CATH AND CORONARY ANGIOGRAPHY: CATH118249

## 2021-11-05 HISTORY — PX: CORONARY STENT INTERVENTION: CATH118234

## 2021-11-05 LAB — MAGNESIUM: Magnesium: 1.8 mg/dL (ref 1.7–2.4)

## 2021-11-05 LAB — CBC
HCT: 31.7 % — ABNORMAL LOW (ref 36.0–46.0)
Hemoglobin: 10.5 g/dL — ABNORMAL LOW (ref 12.0–15.0)
MCH: 30.8 pg (ref 26.0–34.0)
MCHC: 33.1 g/dL (ref 30.0–36.0)
MCV: 93 fL (ref 80.0–100.0)
Platelets: 234 10*3/uL (ref 150–400)
RBC: 3.41 MIL/uL — ABNORMAL LOW (ref 3.87–5.11)
RDW: 14.7 % (ref 11.5–15.5)
WBC: 10.7 10*3/uL — ABNORMAL HIGH (ref 4.0–10.5)
nRBC: 0 % (ref 0.0–0.2)

## 2021-11-05 LAB — HEPARIN LEVEL (UNFRACTIONATED)
Heparin Unfractionated: <0.1 IU/mL — ABNORMAL LOW (ref 0.30–0.70)
Heparin Unfractionated: 1 IU/mL — ABNORMAL HIGH (ref 0.30–0.70)

## 2021-11-05 LAB — POCT ACTIVATED CLOTTING TIME
Activated Clotting Time: 311 seconds
Activated Clotting Time: 335 seconds

## 2021-11-05 LAB — T4, FREE: Free T4: 1.07 ng/dL (ref 0.61–1.12)

## 2021-11-05 LAB — BASIC METABOLIC PANEL
Anion gap: 5 (ref 5–15)
BUN: 23 mg/dL (ref 8–23)
CO2: 26 mmol/L (ref 22–32)
Calcium: 8.6 mg/dL — ABNORMAL LOW (ref 8.9–10.3)
Chloride: 105 mmol/L (ref 98–111)
Creatinine, Ser: 0.6 mg/dL (ref 0.44–1.00)
GFR, Estimated: >60 mL/min (ref >60)
Glucose, Bld: 298 mg/dL — ABNORMAL HIGH (ref 70–99)
Potassium: 3.7 mmol/L (ref 3.5–5.1)
Sodium: 136 mmol/L (ref 135–145)

## 2021-11-05 LAB — GLUCOSE, CAPILLARY
Glucose-Capillary: 204 mg/dL — ABNORMAL HIGH (ref 70–99)
Glucose-Capillary: 134 mg/dL — ABNORMAL HIGH (ref 70–99)
Glucose-Capillary: 229 mg/dL — ABNORMAL HIGH (ref 70–99)
Glucose-Capillary: 189 mg/dL — ABNORMAL HIGH (ref 70–99)

## 2021-11-05 SURGERY — LEFT HEART CATH AND CORONARY ANGIOGRAPHY
Anesthesia: LOCAL

## 2021-11-05 MED ORDER — VERAPAMIL HCL 2.5 MG/ML IV SOLN
INTRAVENOUS | Status: AC
  Filled 2021-11-05: qty 2

## 2021-11-05 MED ORDER — HEPARIN (PORCINE) IN NACL 1000-0.9 UT/500ML-% IV SOLN
INTRAVENOUS | Status: AC
  Filled 2021-11-05: qty 500

## 2021-11-05 MED ORDER — NITROGLYCERIN 1 MG/10 ML FOR IR/CATH LAB
INTRA_ARTERIAL | Status: DC | PRN
  Administered 2021-11-05: 100 ug via INTRACORONARY

## 2021-11-05 MED ORDER — IOHEXOL 350 MG/ML SOLN
INTRAVENOUS | Status: DC | PRN
  Administered 2021-11-05: 125 mL via INTRA_ARTERIAL

## 2021-11-05 MED ORDER — LIDOCAINE HCL (PF) 1 % IJ SOLN
INTRAMUSCULAR | Status: DC | PRN
  Administered 2021-11-05: 2 mL

## 2021-11-05 MED ORDER — NITROGLYCERIN 1 MG/10 ML FOR IR/CATH LAB
INTRA_ARTERIAL | Status: AC
  Filled 2021-11-05: qty 10

## 2021-11-05 MED ORDER — SODIUM CHLORIDE 0.9 % IV SOLN: INTRAVENOUS | Status: DC

## 2021-11-05 MED ORDER — MAGNESIUM OXIDE -MG SUPPLEMENT 400 (240 MG) MG PO TABS
400.0000 mg | ORAL_TABLET | Freq: Two times a day (BID) | ORAL | Status: DC
  Administered 2021-11-05: 400 mg via ORAL
  Filled 2021-11-05: qty 1

## 2021-11-05 MED ORDER — FENTANYL CITRATE (PF) 100 MCG/2ML IJ SOLN
INTRAMUSCULAR | Status: DC | PRN
  Administered 2021-11-05 (×4): 25 ug via INTRAVENOUS

## 2021-11-05 MED ORDER — ASPIRIN 81 MG PO CHEW
81.0000 mg | CHEWABLE_TABLET | Freq: Every day | ORAL | Status: DC
  Administered 2021-11-06 – 2021-11-10 (×5): 81 mg via ORAL
  Filled 2021-11-05 (×5): qty 1

## 2021-11-05 MED ORDER — POTASSIUM CHLORIDE CRYS ER 20 MEQ PO TBCR
40.0000 meq | EXTENDED_RELEASE_TABLET | Freq: Once | ORAL | Status: AC
Start: 2021-11-05 — End: 2021-11-05
  Administered 2021-11-05: 40 meq via ORAL
  Filled 2021-11-05: qty 2

## 2021-11-05 MED ORDER — HEPARIN (PORCINE) IN NACL 1000-0.9 UT/500ML-% IV SOLN
INTRAVENOUS | Status: DC | PRN
  Administered 2021-11-05 (×2): 500 mL

## 2021-11-05 MED ORDER — FENTANYL CITRATE (PF) 100 MCG/2ML IJ SOLN
INTRAMUSCULAR | Status: AC
  Filled 2021-11-05: qty 2

## 2021-11-05 MED ORDER — LABETALOL HCL 5 MG/ML IV SOLN: 10.0000 mg | INTRAVENOUS | Status: AC | PRN

## 2021-11-05 MED ORDER — MIDAZOLAM HCL 2 MG/2ML IJ SOLN
INTRAMUSCULAR | Status: DC | PRN
  Administered 2021-11-05 (×3): 1 mg via INTRAVENOUS

## 2021-11-05 MED ORDER — MAGNESIUM SULFATE 2 GM/50ML IV SOLN
2.0000 g | Freq: Once | INTRAVENOUS | Status: AC
  Administered 2021-11-05: 2 g via INTRAVENOUS
  Filled 2021-11-05: qty 50

## 2021-11-05 MED ORDER — MIDAZOLAM HCL 2 MG/2ML IJ SOLN
INTRAMUSCULAR | Status: AC
  Filled 2021-11-05: qty 2

## 2021-11-05 MED ORDER — SODIUM CHLORIDE 0.9% FLUSH
3.0000 mL | Freq: Two times a day (BID) | INTRAVENOUS | Status: DC
  Administered 2021-11-06 – 2021-11-09 (×7): 3 mL via INTRAVENOUS

## 2021-11-05 MED ORDER — SODIUM CHLORIDE 0.9% FLUSH: 3.0000 mL | INTRAVENOUS | Status: DC | PRN

## 2021-11-05 MED ORDER — HEPARIN SODIUM (PORCINE) 1000 UNIT/ML IJ SOLN
INTRAMUSCULAR | Status: AC
  Filled 2021-11-05: qty 10

## 2021-11-05 MED ORDER — ONDANSETRON HCL 4 MG/2ML IJ SOLN
4.0000 mg | Freq: Four times a day (QID) | INTRAMUSCULAR | Status: DC | PRN
  Administered 2021-11-05 – 2021-11-08 (×5): 4 mg via INTRAVENOUS
  Filled 2021-11-05 (×4): qty 2

## 2021-11-05 MED ORDER — SODIUM CHLORIDE 0.9 % IV SOLN: 250.0000 mL | INTRAVENOUS | Status: DC | PRN

## 2021-11-05 MED ORDER — VERAPAMIL HCL 2.5 MG/ML IV SOLN
INTRAVENOUS | Status: DC | PRN
  Administered 2021-11-05: 10 mL via INTRA_ARTERIAL

## 2021-11-05 MED ORDER — SODIUM CHLORIDE 0.9 % IV SOLN: INTRAVENOUS | Status: AC

## 2021-11-05 MED ORDER — HEPARIN SODIUM (PORCINE) 1000 UNIT/ML IJ SOLN
INTRAMUSCULAR | Status: DC | PRN
  Administered 2021-11-05: 3000 [IU] via INTRAVENOUS
  Administered 2021-11-05: 4000 [IU] via INTRAVENOUS

## 2021-11-05 MED ORDER — HYDRALAZINE HCL 20 MG/ML IJ SOLN: 10.0000 mg | INTRAMUSCULAR | Status: AC | PRN

## 2021-11-05 MED ORDER — HEPARIN BOLUS VIA INFUSION
2000.0000 [IU] | Freq: Once | INTRAVENOUS | Status: AC
  Administered 2021-11-05: 2000 [IU] via INTRAVENOUS
  Filled 2021-11-05: qty 2000

## 2021-11-05 SURGICAL SUPPLY — 32 items
BALL SAPPHIRE NC24 3.0X18 (BALLOONS) ×2
BALLN SAPPHIRE 2.5X12 (BALLOONS) ×2
BALLN SAPPHIRE 2.5X15 (BALLOONS) ×2
BALLN ~~LOC~~ EUPHORA RX 3.25X12 (BALLOONS) ×2
BALLOON SAPPHIRE 2.5X12 (BALLOONS) IMPLANT
BALLOON SAPPHIRE 2.5X15 (BALLOONS) IMPLANT
BALLOON SAPPHIRE NC24 3.0X18 (BALLOONS) IMPLANT
BALLOON ~~LOC~~ EUPHORA RX 3.25X12 (BALLOONS) IMPLANT
BAND CMPR LRG ZPHR (HEMOSTASIS) ×1
BAND ZEPHYR COMPRESS 30 LONG (HEMOSTASIS) ×1 IMPLANT
CATH INFINITI JR4 5F (CATHETERS) ×1 IMPLANT
CATH JL3.5 FR DIAG (CATHETERS) ×1 IMPLANT
CATH LAUNCHER 6FR EBU3.5 (CATHETERS) ×1 IMPLANT
CATH VISTA GUIDE 6FR JR4 (CATHETERS) ×1 IMPLANT
ELECT DEFIB PAD ADLT CADENCE (PAD) ×1 IMPLANT
GLIDESHEATH SLEND SS 6F .021 (SHEATH) ×1 IMPLANT
GUIDEWIRE INQWIRE 1.5J.035X260 (WIRE) IMPLANT
INQWIRE 1.5J .035X260CM (WIRE) ×2
KIT ENCORE 26 ADVANTAGE (KITS) ×1 IMPLANT
KIT HEART LEFT (KITS) ×3 IMPLANT
KIT HEMO VALVE WATCHDOG (MISCELLANEOUS) ×1 IMPLANT
PACK CARDIAC CATHETERIZATION (CUSTOM PROCEDURE TRAY) ×3 IMPLANT
STENT SYNERGY XD 2.75X24 (Permanent Stent) IMPLANT
STENT SYNERGY XD 3.0X16 (Permanent Stent) IMPLANT
SYNERGY XD 2.75X24 (Permanent Stent) ×2 IMPLANT
SYNERGY XD 3.0X16 (Permanent Stent) ×2 IMPLANT
SYR MEDRAD MARK 7 150ML (SYRINGE) ×3 IMPLANT
TRANSDUCER W/STOPCOCK (MISCELLANEOUS) ×3 IMPLANT
TUBING CIL FLEX 10 FLL-RA (TUBING) ×3 IMPLANT
WIRE COUGAR XT STRL 190CM (WIRE) ×1 IMPLANT
WIRE HI TORQ VERSACORE-J 145CM (WIRE) ×1 IMPLANT
WIRE HI TORQ WHISPER MS 190CM (WIRE) ×1 IMPLANT

## 2021-11-05 NOTE — Progress Notes (Signed)
Inpatient Diabetes Program Recommendations  AACE/ADA: New Consensus Statement on Inpatient Glycemic Control (2015)  Target Ranges:  Prepandial:   less than 140 mg/dL      Peak postprandial:   less than 180 mg/dL (1-2 hours)      Critically ill patients:  140 - 180 mg/dL   Lab Results  Component Value Date   GLUCAP 204 (H) 11/05/2021   HGBA1C 8.8 (H) 11/03/2021    Review of Glycemic Control  Latest Reference Range & Units 11/04/21 07:52 11/04/21 11:55 11/04/21 16:08 11/04/21 21:13 11/05/21 08:12  Glucose-Capillary 70 - 99 mg/dL 229 (H) 261 (H) 189 (H) 208 (H) 204 (H)   Diabetes history: DM 2 Outpatient Diabetes medications: Farxiga 10 mg Daily Current orders for Inpatient glycemic control: Semglee 20 units Daily Novolog 0-9 units tid + hs Novolog 3 units tid meal coverage  A1c 8.8% on 5/29  Inpatient Diabetes Program Recommendations:    -   Consider increasing Semglee to 25 units.  Thanks,  Tama Headings RN, MSN, BC-ADM Inpatient Diabetes Coordinator Team Pager [phone] (8a-5p)

## 2021-11-05 NOTE — Assessment & Plan Note (Addendum)
Coronary artery disease.   Sp PTCA and stenting to RCA and circumflex. No chest pain.  Medical therapy with aspirin, clopidogrel and atorvastatin. Plan to stop aspirin after 30 days, and continue with clopidogrel for 6 to 12 months as tolerated.  On apixaban for atrial fibrillation.

## 2021-11-05 NOTE — Assessment & Plan Note (Signed)
Continue insulin therapy with sliding scale, pre meal and basal.  Continue statin and ezetimibe.  Her fasting glucose today is 138 mg/dl and she is tolerating po well.
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
## 2021-11-05 NOTE — Progress Notes (Signed)
Pt refusing cpap for the night. ?
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
## 2021-11-05 NOTE — Assessment & Plan Note (Signed)
Hypomagnesemia.   Stable renal function with serum cr at 0,60, with K at 4,1 and serum bicarbonate at 27. Plan to follow up renal function in am.

## 2021-11-05 NOTE — Interval H&P Note (Signed)
History and Physical Interval Note:  11/05/2021 12:22 PM  Casey Ramos  has presented today for surgery, with the diagnosis of nstemi.  The various methods of treatment have been discussed with the patient and family. After consideration of risks, benefits and other options for treatment, the patient has consented to  Procedure(s): LEFT HEART CATH AND CORONARY ANGIOGRAPHY (N/A) as a surgical intervention.  The patient's history has been reviewed, patient examined, no change in status, stable for surgery.  I have reviewed the patient's chart and labs.  Questions were answered to the patient's satisfaction.     Sherren Mocha

## 2021-11-05 NOTE — Progress Notes (Signed)
Patient HR is sustaining 140-150s at rest and without symptoms. Patient is resting with her eye closed. The blood pressure is within defined limits. Opyd advised to contact Cardiology. Nipp MD advised to monitor patient for now.

## 2021-11-05 NOTE — Assessment & Plan Note (Signed)
Chronic hypoxemic respiratory failure.   No clinical signs of COPD exacerbation.

## 2021-11-05 NOTE — Assessment & Plan Note (Signed)
Reactive leukocytosis.  Iron panel with serum iron at 22, ferritin 174, transferrin saturation 7 and TIBC 298.  Wbc 10,7  SP IV iron infusion.

## 2021-11-05 NOTE — Progress Notes (Signed)
OT Cancellation Note  Patient Details Name: RILDA BULLS MRN: [id number] DOB: 07-27-1955   Cancelled Treatment:    Reason Eval/Treat Not Completed: Patient declined, felling tired from cath, wanting to defer OT eval till next date.    Athanasia Stanwood D Sopheap Boehle 11/05/2021, 2:46 PM

## 2021-11-05 NOTE — Plan of Care (Signed)
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
## 2021-11-05 NOTE — Assessment & Plan Note (Addendum)
Echocardiogram from 3967 with LV systolic function 30 to 28%, with preserved RV systolic function, normal pulmonary systolic pressure. No significant valvular disease.  LV with mid to distal inferior wall and mid infero lateral segment akinetic. Mid inferoseptal segment and apical segment are hypokinetic.   History of VT sp ICD.   Urine output is 1,350 over last 24 hrs (note intake 1,417 ml).  Systolic blood pressure 83 to 111 mmHg.   Continue with metoprolol and entresto.  Holding on diuretic therapy for now.

## 2021-11-05 NOTE — TOC Benefit Eligibility Note (Signed)
Patient Teacher, English as a foreign language completed.    The patient is currently admitted and upon discharge could be taking Eliquis 5 mg.  The current 30 day co-pay is, $0.00.   The patient is insured through [place], [place] Patient Advocate Specialist [place] Patient Advocate Team Direct Number: [phone]  Fax: [phone]

## 2021-11-05 NOTE — Progress Notes (Signed)
ANTICOAGULATION CONSULT NOTE - Follow Up Consult  Pharmacy Consult for heparin Indication: atrial fibrillation/ACS  Patient Measurements: Height: '5\' 1"'$  (154.9 cm) Weight: 70 kg (154 lb 5.2 oz) IBW/kg (Calculated) : 47.8 Heparin Dosing Weight: 62.8 kg  Labs: Recent Labs    11/03/21 0640 11/03/21 1831 11/03/21 2112 11/03/21 2230 11/04/21 0504 11/04/21 1302 11/05/21 0242  HGB 11.5*  --   --   --  9.9*  --  10.5*  HCT 34.9*  --   --   --  29.0*  --  31.7*  PLT 406*  --   --   --  313  --  234  LABPROT  --   --  16.0*  --   --   --   --   INR  --   --  1.3*  --   --   --   --   HEPARINUNFRC  --   --   --   --  0.12* 0.50 <0.10*  CREATININE 0.54  --   --   --  0.45  --   --   TROPONINIHS 373* 883*  --  2,073*  --   --   --     Assessment: 66yo female started on heparin for new Afib and chest pain  Heparin level undetectable after previously being therapeutic on heparin drip rate 1050 units/hr.No infusion issues or signs of bleeding, cbc stable   Goal of Therapy:  Heparin level 0.3-0.7 units/ml Monitor platelets by anticoagulation protocol: Yes  Plan:  Bolus heparin IV 2000 units Increase heparin drip gtt to 1250 units/hr Daily heparin level cbc  Monitor s/s bleeding    Georga Bora, PharmD Clinical Pharmacist 11/05/2021 3:49 AM Please check AMION for all [place] numbers

## 2021-11-05 NOTE — Assessment & Plan Note (Addendum)
Telemetry personally reviewed, heart rate has been most of the time in the 105 bpm range with peaks up to 120 and 130.  Systolic blood pressure 938 and 10 22mHg.   Plan to continue AV blockade with metoprolol XL 100 mg and digoxin. Anticoagulation with apixaban. If continue with RVR may need further antiarrhythmic therapy and direct current cardioversion.   Possible DC cardioversion on Monday.

## 2021-11-06 ENCOUNTER — Encounter (HOSPITAL_COMMUNITY): Payer: Self-pay | Admitting: Cardiovascular Disease

## 2021-11-06 ENCOUNTER — Other Ambulatory Visit (HOSPITAL_COMMUNITY): Payer: Self-pay

## 2021-11-06 DIAGNOSIS — E876 Hypokalemia: Secondary | ICD-10-CM | POA: Diagnosis not present

## 2021-11-06 DIAGNOSIS — I4891 Unspecified atrial fibrillation: Secondary | ICD-10-CM | POA: Diagnosis not present

## 2021-11-06 DIAGNOSIS — I5023 Acute on chronic systolic (congestive) heart failure: Secondary | ICD-10-CM | POA: Diagnosis not present

## 2021-11-06 DIAGNOSIS — I214 Non-ST elevation (NSTEMI) myocardial infarction: Secondary | ICD-10-CM | POA: Diagnosis not present

## 2021-11-06 LAB — GLUCOSE, CAPILLARY
Glucose-Capillary: 236 mg/dL — ABNORMAL HIGH (ref 70–99)
Glucose-Capillary: 228 mg/dL — ABNORMAL HIGH (ref 70–99)
Glucose-Capillary: 103 mg/dL — ABNORMAL HIGH (ref 70–99)
Glucose-Capillary: 112 mg/dL — ABNORMAL HIGH (ref 70–99)

## 2021-11-06 LAB — CBC
HCT: 30.3 % — ABNORMAL LOW (ref 36.0–46.0)
Hemoglobin: 9.7 g/dL — ABNORMAL LOW (ref 12.0–15.0)
MCH: 29.8 pg (ref 26.0–34.0)
MCHC: 32 g/dL (ref 30.0–36.0)
MCV: 93.2 fL (ref 80.0–100.0)
Platelets: 255 10*3/uL (ref 150–400)
RBC: 3.25 MIL/uL — ABNORMAL LOW (ref 3.87–5.11)
RDW: 14.7 % (ref 11.5–15.5)
WBC: 9.7 10*3/uL (ref 4.0–10.5)
nRBC: 0 % (ref 0.0–0.2)

## 2021-11-06 LAB — BASIC METABOLIC PANEL
Anion gap: 6 (ref 5–15)
BUN: 18 mg/dL (ref 8–23)
CO2: 27 mmol/L (ref 22–32)
Calcium: 8.3 mg/dL — ABNORMAL LOW (ref 8.9–10.3)
Chloride: 108 mmol/L (ref 98–111)
Creatinine, Ser: 0.6 mg/dL (ref 0.44–1.00)
GFR, Estimated: >60 mL/min (ref >60)
Glucose, Bld: 282 mg/dL — ABNORMAL HIGH (ref 70–99)
Potassium: 4.1 mmol/L (ref 3.5–5.1)
Sodium: 141 mmol/L (ref 135–145)

## 2021-11-06 MED ORDER — DIGOXIN 125 MCG PO TABS
0.1250 mg | ORAL_TABLET | Freq: Every day | ORAL | Status: DC
  Administered 2021-11-07 – 2021-11-10 (×4): 0.125 mg via ORAL
  Filled 2021-11-06 (×4): qty 1

## 2021-11-06 MED ORDER — DIGOXIN 250 MCG PO TABS
0.2500 mg | ORAL_TABLET | ORAL | Status: AC
  Administered 2021-11-06 (×2): 0.25 mg via ORAL
  Filled 2021-11-06 (×2): qty 1

## 2021-11-06 MED ORDER — SPIRONOLACTONE 25 MG PO TABS
25.0000 mg | ORAL_TABLET | Freq: Every day | ORAL | Status: DC
  Administered 2021-11-06 – 2021-11-10 (×5): 25 mg via ORAL
  Filled 2021-11-06 (×5): qty 1

## 2021-11-06 MED ORDER — APIXABAN 5 MG PO TABS
5.0000 mg | ORAL_TABLET | Freq: Two times a day (BID) | ORAL | Status: DC
  Administered 2021-11-06 – 2021-11-10 (×9): 5 mg via ORAL
  Filled 2021-11-06 (×9): qty 1

## 2021-11-06 MED ORDER — METOPROLOL SUCCINATE ER 50 MG PO TB24
75.0000 mg | ORAL_TABLET | Freq: Every day | ORAL | Status: DC
  Administered 2021-11-06: 75 mg via ORAL
  Filled 2021-11-06 (×2): qty 1

## 2021-11-06 MED ORDER — DAPAGLIFLOZIN PROPANEDIOL 10 MG PO TABS
10.0000 mg | ORAL_TABLET | Freq: Every day | ORAL | Status: DC
  Administered 2021-11-06 – 2021-11-10 (×5): 10 mg via ORAL
  Filled 2021-11-06 (×5): qty 1

## 2021-11-06 MED ORDER — METOCLOPRAMIDE HCL 5 MG/ML IJ SOLN
5.0000 mg | Freq: Three times a day (TID) | INTRAMUSCULAR | Status: DC | PRN
Start: 2021-11-06 — End: 2021-11-10
  Administered 2021-11-06: 5 mg via INTRAVENOUS
  Filled 2021-11-06: qty 2

## 2021-11-06 NOTE — Progress Notes (Signed)
Mobility Specialist Progress Note    11/06/21 1656  Mobility  Activity Transferred to/from [facility]  Level of Assistance Contact guard assist, steadying assist  Assistive Device Other (Comment) (HHA)  Activity Response Tolerated well  $Mobility charge 1 Mobility   C/o nausea. No void. Left in bed with call bell in reach.   Hildred Alamin Mobility Specialist  Primary: 5N M.S. Phone: [phone] Secondary: 6N M.S. Phone: [phone]
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
## 2021-11-06 NOTE — NC FL2 (Signed)
[place] LEVEL OF CARE SCREENING TOOL     IDENTIFICATION  Patient Name: Casey Ramos Birthdate: July 31, 1955 Sex: female Admission Date (Current Location): 11/03/2021  [facility] [place] Number:  Herbalist and Address:  [facility] [place], [place] [address], [place], [place] [address]      Provider Number: [id number]  Attending Physician Name and Address:  Tawni Millers,*  Relative Name and Phone Number:       Current Level of Care: Hospital Recommended Level of Care: [place] Prior Approval Number:    Date Approved/Denied:   PASRR Number: [id number]  Discharge Plan: SNF    Current Diagnoses: Patient Active Problem List   Diagnosis Date Noted   Dyslipidemia associated with type 2 diabetes mellitus ([place]) 11/05/2021   History of COPD 11/05/2021   Atrial fibrillation with RVR ([place]) 11/03/2021   Acute on chronic systolic CHF (congestive heart failure) ([place]) 11/03/2021   NSTEMI (non-ST elevated myocardial infarction) ([place]) 11/03/2021   Iron deficiency anemia 11/03/2021   Hypokalemia 11/03/2021   Asthma 01/27/2021   Depression 01/27/2021   Bartholin's gland abscess 11/13/2020   GERD (gastroesophageal reflux disease) 11/13/2020   Hypercholesterolemia 11/13/2020   Migraine headache 11/13/2020   RLS (restless legs syndrome) 11/13/2020   Chronic toe pain, right foot 04/17/2019   Poorly controlled type 2 diabetes mellitus with peripheral neuropathy ([place]) 04/17/2019   PAD (peripheral artery disease) ([place]) 04/17/2019   Current smoker 02/12/2017   Dyslipidemia 02/12/2017   Hypertensive heart disease 02/12/2017   H/O: CVA (cerebrovascular accident) 02/12/2017   Other emphysema ([place]) 02/12/2017   Essential hypertension 02/12/2017   Inclusion cyst 03/18/2016   Benign neoplasm of skin or subcutaneous tissue 02/26/2016   Diabetes mellitus without complication ([place]) 97/98/9211   Hammer toes of both feet  07/29/2015   Ingrowing nail 07/29/2015    Orientation RESPIRATION BLADDER Height & Weight     Time, Place, Situation, Self (WDL)  O2 (Nasal Cannula 3 liters) Incontinent Weight: 147 lb 3.2 oz (66.8 kg) Height:  '5\' 1"'$  (154.9 cm)  BEHAVIORAL SYMPTOMS/MOOD NEUROLOGICAL BOWEL NUTRITION STATUS      Continent Diet (Please see discharge summary)  AMBULATORY STATUS COMMUNICATION OF NEEDS Skin   Limited Assist Verbally Other (Comment) (Appropriate for ethnicity,dry,blister,chest,rash,hip,right)                       Personal Care Assistance Level of Assistance  Bathing, Feeding, Dressing Bathing Assistance: Limited assistance Feeding assistance: Independent (able to feed self) Dressing Assistance: Limited assistance     Functional Limitations Info  Sight, Hearing, Speech Sight Info: Adequate Hearing Info: Adequate Speech Info: Adequate    SPECIAL CARE FACTORS FREQUENCY  PT (By licensed PT), OT (By licensed OT)     PT Frequency: 5x min weekly OT Frequency: 5x min weekly            Contractures Contractures Info: Not present    Additional Factors Info  Code Status, Allergies, Insulin Sliding Scale Code Status Info: DNR Allergies Info: Ibuprofen,Morphine,Hydrocodone-acetaminophen,Other,Imitrex (sumatriptan),Naprosyn (naproxen),Pineapple,Chantix (varenicline),Codeine,Tylenol (acetaminophen)   Insulin Sliding Scale Info: insulin aspart (novoLOG) injection 0-5 Units daily at bedtime,insulin aspart (novoLOG) injection 0-9 Units 3 times daily with meals,insulin aspart (novoLOG) injection 3 Units 3 times daily with meals,insulin glargine-yfgn (SEMGLEE) injection 20 Units daily       Current Medications (11/06/2021):  This is the current hospital active medication list Current Facility-Administered Medications  Medication Dose Route Frequency Provider Last Rate  Last Admin   0.9 %  sodium chloride infusion   Intravenous Continuous Sherren Mocha, MD 135 mL/hr at 11/06/21 0400  Infusion Verify at 11/06/21 0400   0.9 %  sodium chloride infusion  250 mL Intravenous PRN Sherren Mocha, MD       ALPRAZolam Duanne Moron) tablet 0.5 mg  0.5 mg Oral BID PRN Sherren Mocha, MD   0.5 mg at 11/06/21 1006   apixaban (ELIQUIS) tablet 5 mg  5 mg Oral BID Margie Billet, NP   5 mg at 11/06/21 5643   aspirin chewable tablet 81 mg  81 mg Oral Daily Sherren Mocha, MD   81 mg at 11/06/21 0957   atorvastatin (LIPITOR) tablet 80 mg  80 mg Oral Daily Sherren Mocha, MD   80 mg at 11/06/21 3295   clopidogrel (PLAVIX) tablet 75 mg  75 mg Oral Daily Sherren Mocha, MD   75 mg at 11/06/21 0957   dapagliflozin propanediol (FARXIGA) tablet 10 mg  10 mg Oral Daily Arrien, Jimmy Picket, MD       Derrill Memo ON 11/07/2021] digoxin (LANOXIN) tablet 0.125 mg  0.125 mg Oral Daily Croitoru, Mihai, MD       digoxin (LANOXIN) tablet 0.25 mg  0.25 mg Oral Q4H Croitoru, Mihai, MD   0.25 mg at 11/06/21 1241   ezetimibe (ZETIA) tablet 10 mg  10 mg Oral Daily Sherren Mocha, MD   10 mg at 11/06/21 0956   insulin aspart (novoLOG) injection 0-5 Units  0-5 Units Subcutaneous QHS Sherren Mocha, MD   2 Units at 11/04/21 2135   insulin aspart (novoLOG) injection 0-9 Units  0-9 Units Subcutaneous TID WC Sherren Mocha, MD   3 Units at 11/06/21 1241   insulin aspart (novoLOG) injection 3 Units  3 Units Subcutaneous TID WC Sherren Mocha, MD   3 Units at 11/06/21 1241   insulin glargine-yfgn (SEMGLEE) injection 20 Units  20 Units Subcutaneous Daily Sherren Mocha, MD   20 Units at 11/06/21 0957   levalbuterol (XOPENEX) nebulizer solution 0.63 mg  0.63 mg Nebulization Q6H PRN Sherren Mocha, MD   0.63 mg at 11/05/21 1857   metoprolol succinate (TOPROL-XL) 24 hr tablet 75 mg  75 mg Oral Daily Margie Billet, NP   75 mg at 11/06/21 0956   ondansetron (ZOFRAN) injection 4 mg  4 mg Intravenous Q6H PRN Sherren Mocha, MD   4 mg at 11/06/21 1413   oxyCODONE (Oxy IR/ROXICODONE) immediate release tablet 5 mg  5 mg Oral Q6H PRN  Sherren Mocha, MD   5 mg at 11/06/21 1884   sacubitril-valsartan (ENTRESTO) 49-51 mg per tablet  1 tablet Oral BID Sherren Mocha, MD   1 tablet at 11/06/21 0957   sodium chloride flush (NS) 0.9 % injection 3 mL  3 mL Intravenous Janalee Dane, MD   3 mL at 11/06/21 1007   sodium chloride flush (NS) 0.9 % injection 3 mL  3 mL Intravenous Janalee Dane, MD   3 mL at 11/06/21 0957   sodium chloride flush (NS) 0.9 % injection 3 mL  3 mL Intravenous PRN Sherren Mocha, MD       spironolactone (ALDACTONE) tablet 25 mg  25 mg Oral Daily Arrien, Jimmy Picket, MD         Discharge Medications: Please see discharge summary for a list of discharge medications.  Relevant Imaging Results:  Relevant Lab Results:   Additional Information [phone], Both [place], LCSWA

## 2021-11-06 NOTE — TOC Initial Note (Signed)
Transition of Care [facility]) - Initial/Assessment Note    Patient Details  Name: Casey Ramos MRN: [id number] Date of Birth: [birth year]  [facility]) CM/SW Contact:    Milas Gain, [place] Phone Number: 11/06/2021, 1:17 PM  Clinical Narrative:                  CSW received consult for possible SNF placement at time of discharge. CSW spoke with patient regarding PT recommendation of SNF placement at time of discharge. Patient expressed understanding of PT recommendation and is agreeable to SNF placement at time of discharge. Patient gave CSW permission to fax out initial referral near the [place] and [place] area. Patient reports if she does not like any of SNF bed offers then she may go home at dc.CSW discussed insurance authorization process.Patient reports she has received the COVID vaccines. Patient expressed being hopeful for rehab and to feel better soon. No further questions reported at this time. CSW to continue to follow and assist with discharge planning needs.    Expected Discharge Plan: Skilled Nursing Facility Barriers to Discharge: Continued Medical Work up   Patient Goals and CMS Choice Patient states their goals for this hospitalization and ongoing recovery are:: SNF CMS Medicare.gov Compare Post Acute Care list provided to:: Patient Choice offered to / list presented to : Patient  Expected Discharge Plan and Services Expected Discharge Plan: [place] In-house Referral: Clinical Social Work Discharge Planning Services: CM Consult Post Acute Care Choice: [place] arrangements for the past 2 months: Single Family Home                 DME Arranged: Oxygen DME Agency:  ([place] Patient.) Date DME Agency Contacted: 11/04/21 Time DME Agency Contacted: [phone] Representative spoke with at DME Agency: [name]            Prior Living Arrangements/Services Living arrangements for the past 2 months: [place]  with:: Self Patient language and need for interpreter reviewed:: Yes Do you feel safe going back to the place where you live?: No   SNF  Need for Family Participation in Patient Care: Yes (Comment) Care giver support system in place?: Yes (comment) Current home services: DME (patient has oxygen via [place] Patient, Cane and Conservation officer, nature.) Criminal Activity/Legal Involvement Pertinent to Current Situation/Hospitalization: No - Comment as needed  Activities of Daily Living Home Assistive Devices/Equipment: Environmental consultant (specify type), Wheelchair, CBG Meter, Scales ADL Screening (condition at time of admission) Patient's cognitive ability adequate to safely complete daily activities?: Yes Is the patient deaf or have difficulty hearing?: No Does the patient have difficulty seeing, even when wearing glasses/contacts?: No Does the patient have difficulty concentrating, remembering, or making decisions?: No Patient able to express need for assistance with ADLs?: Yes Does the patient have difficulty dressing or bathing?: Yes Independently performs ADLs?: No Communication: Independent Dressing (OT): Needs assistance Is this a change from baseline?: Pre-admission baseline Grooming: Needs assistance Is this a change from baseline?: Pre-admission baseline Feeding: Needs assistance Is this a change from baseline?: Pre-admission baseline Bathing: Needs assistance Is this a change from baseline?: Pre-admission baseline Toileting: Needs assistance Is this a change from baseline?: Pre-admission baseline In/Out Bed: Needs assistance Is this a change from baseline?: Pre-admission baseline Walks in Home: Needs assistance Is this a change from baseline?: Pre-admission baseline Does the patient have difficulty walking or climbing stairs?: Yes (ambulates with walker; also has w/c & electric scooter) Weakness of Legs: Both Weakness  of Arms/Hands: Both  Permission Sought/Granted Permission sought to  share information with : Case Manager, Family Supports, Chartered certified accountant granted to share information with : Yes, Verbal Permission Granted  Share Information with NAME: Fritz Pickerel  Permission granted to share info w AGENCY: SNF  Permission granted to share info w Relationship: brother  Permission granted to share info w Contact Information: Fritz Pickerel [phone]  Emotional Assessment Appearance:: Appears stated age Attitude/Demeanor/Rapport: Gracious Affect (typically observed): Calm Orientation: : Oriented to Self, Oriented to Place, Oriented to  Time, Oriented to Situation (WDL) Alcohol / Substance Use: Not Applicable Psych Involvement: No (comment)  Admission diagnosis:  Elevated troponin [R77.8] Atrial fibrillation with RVR ([place]) [I48.91] Patient Active Problem List   Diagnosis Date Noted   Dyslipidemia associated with type 2 diabetes mellitus ([place]) 11/05/2021   History of COPD 11/05/2021   Atrial fibrillation with RVR ([place]) 11/03/2021   Acute on chronic systolic CHF (congestive heart failure) ([place]) 11/03/2021   NSTEMI (non-ST elevated myocardial infarction) ([place]) 11/03/2021   Iron deficiency anemia 11/03/2021   Hypokalemia 11/03/2021   Asthma 01/27/2021   Depression 01/27/2021   Bartholin's gland abscess 11/13/2020   GERD (gastroesophageal reflux disease) 11/13/2020   Hypercholesterolemia 11/13/2020   Migraine headache 11/13/2020   RLS (restless legs syndrome) 11/13/2020   Chronic toe pain, right foot 04/17/2019   Poorly controlled type 2 diabetes mellitus with peripheral neuropathy ([place]) 04/17/2019   PAD (peripheral artery disease) ([place]) 04/17/2019   Current smoker 02/12/2017   Dyslipidemia 02/12/2017   Hypertensive heart disease 02/12/2017   H/O: CVA (cerebrovascular accident) 02/12/2017   Other emphysema ([place]) 02/12/2017   Essential hypertension 02/12/2017   Inclusion cyst 03/18/2016   Benign neoplasm of skin or subcutaneous tissue 02/26/2016    Diabetes mellitus without complication ([place]) 44/31/5400   Hammer toes of both feet 07/29/2015   Ingrowing nail 07/29/2015   PCP:  Aldona Bar, MD Pharmacy:   [facility] [place], [address] AT [place] [place] [address] Phone: [phone] Fax: [phone]  [facility] [address] [place] [address] Phone: [phone] Fax: [phone]     Social Determinants of Health (SDOH) Interventions    Readmission Risk Interventions     View : No data to display.

## 2021-11-06 NOTE — Progress Notes (Signed)
CARDIAC REHAB PHASE I   PRE:  Rate/Rhythm: 103 A-fib  BP:  Sitting: 120/67      SaO2: 99 2L  MODE:  Ambulation: 0 ft, stod at bedside   POST:  Rate/Rhythm: 130 A-fib  BP:  Sitting:       SaO2:   Saw pt from 1358-1420, upon arrival was in A-fib and complaining of nausea. I was able to convince pt that moving around the room would be helpful. Pt needed moderate assistance standing up with moderate assistance from gait belt. Pt reported that the nausea got worst and that she would "throw up" all over me. I helped pt sit down and will f/u tomorrow with education.   Christen Bame  11/06/2021 2:20 PM

## 2021-11-06 NOTE — TOC Progression Note (Addendum)
Transition of Care [facility]) - Progression Note    Patient Details  Name: MEDHA PIPPEN MRN: [id number] Date of Birth: 02-06-1956  Transition of Care [facility]) CM/SW Contact  Graves-Bigelow, Ocie Cornfield, RN Phone Number: 11/06/2021, 10:24 AM  Clinical Narrative:  [place] Patient [place] office [phone]- Patient has a POC at home and it has not been damaged. Family will bring the POC to the hospital once stable to transition home. Patient will only need a concentrator and supplies delivered to the home once she is discharged. Case Manager to call the above oxygen agency when the patient is ready to discharge and the concentrator can be delivered to the home. Case Manager asked for PT/OT consult-awaiting recommendations.    Expected Discharge Plan: [place] Barriers to Discharge: Continued Medical Work up  Expected Discharge Plan and Services Expected Discharge Plan: [place] In-house Referral: NA Discharge Planning Services: CM Consult Post Acute Care Choice: [place] arrangements for the past 2 months: Single Family Home                 DME Arranged: Oxygen DME Agency:  ([place] Patient.) Date DME Agency Contacted: 11/04/21 Time DME Agency Contacted: [phone] Representative spoke with at DME Agency: [name]    Readmission Risk Interventions     View : No data to display.
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
## 2021-11-06 NOTE — Care Management Important Message (Signed)
Important Message  Patient Details  Name: NINI CAVAN MRN: [id number] Date of Birth: 1955-08-14   Medicare Important Message Given:  Yes     Shelda Altes 11/06/2021, 9:21 AM
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
## 2021-11-07 DIAGNOSIS — I5023 Acute on chronic systolic (congestive) heart failure: Secondary | ICD-10-CM | POA: Diagnosis not present

## 2021-11-07 DIAGNOSIS — I214 Non-ST elevation (NSTEMI) myocardial infarction: Secondary | ICD-10-CM

## 2021-11-07 DIAGNOSIS — D509 Iron deficiency anemia, unspecified: Secondary | ICD-10-CM

## 2021-11-07 DIAGNOSIS — E876 Hypokalemia: Secondary | ICD-10-CM | POA: Diagnosis not present

## 2021-11-07 DIAGNOSIS — I4891 Unspecified atrial fibrillation: Secondary | ICD-10-CM | POA: Diagnosis not present

## 2021-11-07 DIAGNOSIS — E1169 Type 2 diabetes mellitus with other specified complication: Secondary | ICD-10-CM | POA: Diagnosis not present

## 2021-11-07 DIAGNOSIS — Z8709 Personal history of other diseases of the respiratory system: Secondary | ICD-10-CM | POA: Diagnosis not present

## 2021-11-07 LAB — GLUCOSE, CAPILLARY
Glucose-Capillary: 113 mg/dL — ABNORMAL HIGH (ref 70–99)
Glucose-Capillary: 158 mg/dL — ABNORMAL HIGH (ref 70–99)
Glucose-Capillary: 152 mg/dL — ABNORMAL HIGH (ref 70–99)
Glucose-Capillary: 145 mg/dL — ABNORMAL HIGH (ref 70–99)

## 2021-11-07 LAB — BASIC METABOLIC PANEL
Anion gap: 5 (ref 5–15)
BUN: 18 mg/dL (ref 8–23)
CO2: 29 mmol/L (ref 22–32)
Calcium: 8.7 mg/dL — ABNORMAL LOW (ref 8.9–10.3)
Chloride: 106 mmol/L (ref 98–111)
Creatinine, Ser: 0.54 mg/dL (ref 0.44–1.00)
GFR, Estimated: >60 mL/min (ref >60)
Glucose, Bld: 92 mg/dL (ref 70–99)
Potassium: 4.3 mmol/L (ref 3.5–5.1)
Sodium: 140 mmol/L (ref 135–145)

## 2021-11-07 MED ORDER — METOPROLOL SUCCINATE ER 100 MG PO TB24
100.0000 mg | ORAL_TABLET | Freq: Every day | ORAL | Status: DC
  Administered 2021-11-07 – 2021-11-08 (×2): 100 mg via ORAL
  Filled 2021-11-07 (×2): qty 1

## 2021-11-07 NOTE — Progress Notes (Signed)
Mobility Specialist Progress Note    11/07/21 1603  Mobility  Activity Ambulated with assistance in hallway  Level of Assistance Minimal assist, patient does 75% or more  Assistive Device Front wheel walker  Distance Ambulated (ft) 40 ft  Activity Response Tolerated fair  $Mobility charge 1 Mobility   Pre-Mobility: 92 HR Post-Mobility: 91 HR  Pt received in bed and agreeable. After ~20' pt stated all her energy was drained. HR maintained in low 100s w/ highest seen at 121. Returned to chair with NT present.   Hildred Alamin Mobility Specialist

## 2021-11-07 NOTE — Progress Notes (Signed)
   11/06/21 1928  Assess: MEWS Score  Temp 98.2 F (36.8 C)  BP 102/70  MAP (mmHg) 82  Pulse Rate (!) 109  ECG Heart Rate (!) 112  Resp 20  SpO2 97 %  O2 Device Nasal Cannula  O2 Flow Rate (L/min) 3 L/min  Assess: MEWS Score  MEWS Temp 0  MEWS Systolic 0  MEWS Pulse 2  MEWS RR 0  MEWS LOC 0  MEWS Score 2  MEWS Score Color Yellow  Assess: if the MEWS score is Yellow or Red  Were vital signs taken at a resting state? Yes  Focused Assessment No change from prior assessment  Does the patient meet 2 or more of the SIRS criteria? No  MEWS guidelines implemented *See Row Information* No, previously yellow, continue vital signs every 4 hours  Treat  MEWS Interventions Other (Comment) (Pt has medications already in place on a schedule. Continue to monitor pt.)  Pain Scale 0-10  Pain Score 0  Escalate  MEWS: Escalate Yellow: discuss with charge nurse/RN and consider discussing with provider and RRT  Notify: Charge Nurse/RN  Name of Charge Nurse/RN Notified Dana, RN  Date Charge Nurse/RN Notified 11/06/21  Time Charge Nurse/RN Notified 2123  Assess: SIRS CRITERIA  SIRS Temperature  0  SIRS Pulse 1  SIRS Respirations  0  SIRS WBC 0  SIRS Score Sum  1

## 2021-11-07 NOTE — Plan of Care (Signed)
  Problem: Activity: Goal: Capacity to carry out activities will improve Outcome: Progressing   Problem: Cardiac: Goal: Ability to achieve and maintain adequate cardiopulmonary perfusion will improve Outcome: Progressing   Problem: Clinical Measurements: Goal: Respiratory complications will improve Outcome: Progressing Goal: Cardiovascular complication will be avoided Outcome: Progressing   Problem: Activity: Goal: Risk for activity intolerance will decrease Outcome: Progressing
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
## 2021-11-07 NOTE — TOC Progression Note (Signed)
Transition of Care [facility]) - Progression Note    Patient Details  Name: Casey Ramos MRN: [id number] Date of Birth: 1956/05/02  Transition of Care [facility]) CM/SW [place], [place] Phone Number: 11/07/2021, 2:43 PM  Clinical Narrative:     CSW provided patient with SNF bed offers. Patient would like to review bed offers with family. CSW to follow up with patient on SNF choice tomorrow. CSW following to start insurance auth. close to patient being medically ready for dc. CSW will continue to follow and assist with patients dc planning needs.  Expected Discharge Plan: Skilled Nursing Facility Barriers to Discharge: Continued Medical Work up  Expected Discharge Plan and Services Expected Discharge Plan: [place] In-house Referral: Clinical Social Work Discharge Planning Services: CM Consult Post Acute Care Choice: [place] arrangements for the past 2 months: Single Family Home                 DME Arranged: Oxygen DME Agency:  ([place] Patient.) Date DME Agency Contacted: 11/04/21 Time DME Agency Contacted: [id number] Representative spoke with at DME Agency: [name]             Social Determinants of Health (SDOH) Interventions Food Insecurity Interventions: Intervention Not Indicated Housing Interventions: Intervention Not Indicated Transportation Interventions: Intervention Not Indicated  Readmission Risk Interventions     View : No data to display.

## 2021-11-07 NOTE — Discharge Instructions (Addendum)
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
## 2021-11-07 NOTE — Progress Notes (Signed)
Pt scheduled for TEE-DCCV on Tues 11/11/21 at noon. Will make NPO on Sunday night in case of cancellations on Monday.  Orders placed.
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
## 2021-11-07 NOTE — Progress Notes (Signed)
Pt called out from room, needed to move to [facility] for BM. Pt stood with min assist and took steps to [place] beside her. HR up to 130 afib. Pt feeling better today although does c/o nausea when asked specifically. Pt had gas on BSC then able to stand and turn to go back to bed. Declines ambulation right now, wants to wait till after lunch. Will f/u as time allows. [place], ACSM 11/07/2021 9:54 AM

## 2021-11-08 DIAGNOSIS — E876 Hypokalemia: Secondary | ICD-10-CM | POA: Diagnosis not present

## 2021-11-08 DIAGNOSIS — I214 Non-ST elevation (NSTEMI) myocardial infarction: Secondary | ICD-10-CM | POA: Diagnosis not present

## 2021-11-08 DIAGNOSIS — I5023 Acute on chronic systolic (congestive) heart failure: Secondary | ICD-10-CM | POA: Diagnosis not present

## 2021-11-08 DIAGNOSIS — I4891 Unspecified atrial fibrillation: Secondary | ICD-10-CM | POA: Diagnosis not present

## 2021-11-08 LAB — BASIC METABOLIC PANEL
Anion gap: 10 (ref 5–15)
BUN: 16 mg/dL (ref 8–23)
CO2: 27 mmol/L (ref 22–32)
Calcium: 9.1 mg/dL (ref 8.9–10.3)
Chloride: 103 mmol/L (ref 98–111)
Creatinine, Ser: 0.55 mg/dL (ref 0.44–1.00)
GFR, Estimated: >60 mL/min (ref >60)
Glucose, Bld: 138 mg/dL — ABNORMAL HIGH (ref 70–99)
Potassium: 4.3 mmol/L (ref 3.5–5.1)
Sodium: 140 mmol/L (ref 135–145)

## 2021-11-08 LAB — T4, FREE: Free T4: 0.91 ng/dL (ref 0.61–1.12)

## 2021-11-08 LAB — GLUCOSE, CAPILLARY
Glucose-Capillary: 149 mg/dL — ABNORMAL HIGH (ref 70–99)
Glucose-Capillary: 97 mg/dL (ref 70–99)
Glucose-Capillary: 244 mg/dL — ABNORMAL HIGH (ref 70–99)
Glucose-Capillary: 186 mg/dL — ABNORMAL HIGH (ref 70–99)

## 2021-11-08 LAB — TSH: TSH: 2.505 u[IU]/mL (ref 0.350–4.500)

## 2021-11-08 LAB — MAGNESIUM: Magnesium: 1.9 mg/dL (ref 1.7–2.4)

## 2021-11-08 MED ORDER — AMIODARONE HCL IN DEXTROSE 360-4.14 MG/200ML-% IV SOLN
60.0000 mg/h | INTRAVENOUS | Status: AC
  Administered 2021-11-08 (×2): 60 mg/h via INTRAVENOUS
  Filled 2021-11-08: qty 200

## 2021-11-08 MED ORDER — AMIODARONE LOAD VIA INFUSION
150.0000 mg | Freq: Once | INTRAVENOUS | Status: AC
Start: 2021-11-08 — End: 2021-11-08
  Administered 2021-11-08: 150 mg via INTRAVENOUS
  Filled 2021-11-08: qty 83.34

## 2021-11-08 MED ORDER — ORAL CARE MOUTH RINSE
15.0000 mL | Freq: Two times a day (BID) | OROMUCOSAL | Status: DC
  Administered 2021-11-08: 15 mL via OROMUCOSAL

## 2021-11-08 MED ORDER — AMIODARONE HCL IN DEXTROSE 360-4.14 MG/200ML-% IV SOLN
30.0000 mg/h | INTRAVENOUS | Status: AC
  Administered 2021-11-09 (×2): 30 mg/h via INTRAVENOUS
  Filled 2021-11-08 (×3): qty 200

## 2021-11-08 NOTE — Progress Notes (Addendum)
CARDIAC REHAB PHASE I   PRE:  Rate/Rhythm: 112 afib    BP: sitting 113/73    SaO2: 94 2L  MODE:  Ambulation: to door   POST:  Rate/Rhythm: 135 afib    BP: sitting 92/66     SaO2: 97 4L  Pt willing to walk today. Asked for assistance getting to EOB, waiting on assist,  however pt can do with min assist. Slow pace with RW with x3 rest stops for SOB. HR up to 135 max. Pt commented "I need to sit down" and returned to EOB. BP lower and when asked, pt sts she was significantly dizzy walking. Also sts she always have CP. Does not specify.   Discussed importance of Plavix, reason for Eliquis. Pt sts she knows how to take NTG and watch for fluid. She sts she weighs daily and manages her meds. She is not interested in quitting smoking or hearing about diet. Declined materials. She also declined more information regarding Afib. Will place referral to [facility] however pt is not interested in doing program, stating transportation is an issue. She sts she lives in her bedroom that has access to outside. Does not go to her kitchen as her niece prepares her food. [place], ACSM 11/08/2021 9:53 AM
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
## 2021-11-08 NOTE — Progress Notes (Signed)
  Amiodarone Drug - Drug Interaction Consult Note  Recommendations: Check digoxin levels at steady state - no need to dose adjust at this time given also a new start today Counsel patient to monitor to report any muscle pain or weakness with statin use   Amiodarone is metabolized by the cytochrome P450 system and therefore has the potential to cause many drug interactions. Amiodarone has an average plasma half-life of 50 days (range 20 to 100 days).   There is potential for drug interactions to occur several weeks or months after stopping treatment and the onset of drug interactions may be slow after initiating amiodarone.   '[x]'$  Statins: Increased risk of myopathy. Simvastatin- restrict dose to '20mg'$  daily. Other statins: counsel patients to report any muscle pain or weakness immediately.  '[]'$  Anticoagulants: Amiodarone can increase anticoagulant effect. Consider warfarin dose reduction. Patients should be monitored closely and the dose of anticoagulant altered accordingly, remembering that amiodarone levels take several weeks to stabilize.  '[]'$  Antiepileptics: Amiodarone can increase plasma concentration of phenytoin, the dose should be reduced. Note that small changes in phenytoin dose can result in large changes in levels. Monitor patient and counsel on signs of toxicity.  '[x]'$  Beta blockers: increased risk of bradycardia, AV block and myocardial depression. Sotalol - avoid concomitant use.  '[]'$   Calcium channel blockers (diltiazem and verapamil): increased risk of bradycardia, AV block and myocardial depression.  '[]'$   Cyclosporine: Amiodarone increases levels of cyclosporine. Reduced dose of cyclosporine is recommended.  '[]'$  Digoxin dose should be halved when amiodarone is started.  '[]'$  Diuretics: increased risk of cardiotoxicity if hypokalemia occurs.  '[]'$  Oral hypoglycemic agents (glyburide, glipizide, glimepiride): increased risk of hypoglycemia. Patient's glucose levels should be monitored  closely when initiating amiodarone therapy.   '[]'$  Drugs that prolong the QT interval:  Torsades de pointes risk may be increased with concurrent use - avoid if possible.  Monitor QTc, also keep magnesium/potassium WNL if concurrent therapy can't be avoided.  Antibiotics: e.g. fluoroquinolones, erythromycin.  Antiarrhythmics: e.g. quinidine, procainamide, disopyramide, sotalol.  Antipsychotics: e.g. phenothiazines, haloperidol.   Lithium, tricyclic antidepressants, and methadone.  Thank you for involving pharmacy in this patient's care.  Elita Quick, PharmD PGY1 Ambulatory Care Pharmacy Resident 11/08/2021 11:08 AM  **Pharmacist phone directory can be found on [email or web address] listed under [place]**
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
## 2021-11-08 NOTE — Progress Notes (Addendum)
Pressure 91/41 with a map of 57. Patient asymptomatic. Notified Dr. Cathlean Sauer. Entresto dc'd. Continue to monitor VSS per [place] q 4 hours, has been yellow previously

## 2021-11-08 NOTE — TOC Progression Note (Signed)
Transition of Care [facility]) - Progression Note    Patient Details  Name: Casey Ramos MRN: [id number] Date of Birth: 12/19/1955  Transition of Care [facility]) CM/SW [place], LCSW Phone Number: 11/08/2021, 4:10 PM  Clinical Narrative:    CSW spoke with pt in regards to her 2 bed offers. Pt chose [facility]. CSW will follow up tomorrow with facility to confirm bed due to current number not working.  TOC team will continue to assist with discharge planning needs.   Expected Discharge Plan: Skilled Nursing Facility Barriers to Discharge: Continued Medical Work up  Expected Discharge Plan and Services Expected Discharge Plan: [place] In-house Referral: Clinical Social Work Discharge Planning Services: CM Consult Post Acute Care Choice: [place] arrangements for the past 2 months: Single Family Home                 DME Arranged: Oxygen DME Agency:  ([place] Patient.) Date DME Agency Contacted: 11/04/21 Time DME Agency Contacted: [phone] Representative spoke with at DME Agency: [name]             Social Determinants of Health (SDOH) Interventions Food Insecurity Interventions: Intervention Not Indicated Housing Interventions: Intervention Not Indicated Transportation Interventions: Intervention Not Indicated  Readmission Risk Interventions     View : No data to display.

## 2021-11-08 NOTE — Progress Notes (Addendum)
Patient started on amiodarone infusion at 1435 running at '60mg'$ /hr after a 150 mg bolus given. Noted to be in a flutter at 1645 and converted to sinus rhythm 62 at 1652. Notified Dr. Cathlean Sauer and [place]. 1716 12 lead completed and in chart. Sinus Rhythm 61. QTC 390.

## 2021-11-09 DIAGNOSIS — I4891 Unspecified atrial fibrillation: Secondary | ICD-10-CM | POA: Diagnosis not present

## 2021-11-09 DIAGNOSIS — Z8709 Personal history of other diseases of the respiratory system: Secondary | ICD-10-CM | POA: Diagnosis not present

## 2021-11-09 DIAGNOSIS — I5023 Acute on chronic systolic (congestive) heart failure: Secondary | ICD-10-CM | POA: Diagnosis not present

## 2021-11-09 DIAGNOSIS — E1169 Type 2 diabetes mellitus with other specified complication: Secondary | ICD-10-CM | POA: Diagnosis not present

## 2021-11-09 LAB — GLUCOSE, CAPILLARY
Glucose-Capillary: 149 mg/dL — ABNORMAL HIGH (ref 70–99)
Glucose-Capillary: 167 mg/dL — ABNORMAL HIGH (ref 70–99)
Glucose-Capillary: 182 mg/dL — ABNORMAL HIGH (ref 70–99)
Glucose-Capillary: 154 mg/dL — ABNORMAL HIGH (ref 70–99)

## 2021-11-09 LAB — BASIC METABOLIC PANEL
Anion gap: 7 (ref 5–15)
BUN: 13 mg/dL (ref 8–23)
CO2: 30 mmol/L (ref 22–32)
Calcium: 8.8 mg/dL — ABNORMAL LOW (ref 8.9–10.3)
Chloride: 103 mmol/L (ref 98–111)
Creatinine, Ser: 0.65 mg/dL (ref 0.44–1.00)
GFR, Estimated: >60 mL/min (ref >60)
Glucose, Bld: 145 mg/dL — ABNORMAL HIGH (ref 70–99)
Potassium: 3.8 mmol/L (ref 3.5–5.1)
Sodium: 140 mmol/L (ref 135–145)

## 2021-11-09 MED ORDER — ALPRAZOLAM 0.5 MG PO TABS
0.5000 mg | ORAL_TABLET | Freq: Three times a day (TID) | ORAL | Status: DC | PRN
Start: 2021-11-09 — End: 2021-11-10
  Administered 2021-11-10: 0.5 mg via ORAL
  Filled 2021-11-09: qty 1

## 2021-11-09 MED ORDER — ALPRAZOLAM 0.5 MG PO TABS
0.5000 mg | ORAL_TABLET | Freq: Once | ORAL | Status: AC
  Administered 2021-11-09: 0.5 mg via ORAL

## 2021-11-09 MED ORDER — AMIODARONE HCL 200 MG PO TABS
200.0000 mg | ORAL_TABLET | Freq: Every day | ORAL | Status: DC
  Administered 2021-11-09 – 2021-11-10 (×2): 200 mg via ORAL
  Filled 2021-11-09 (×2): qty 1

## 2021-11-09 MED ORDER — AMIODARONE HCL 200 MG PO TABS: 200.0000 mg | ORAL_TABLET | Freq: Every day | ORAL | Status: DC

## 2021-11-09 MED ORDER — METOPROLOL SUCCINATE ER 25 MG PO TB24
25.0000 mg | ORAL_TABLET | Freq: Every day | ORAL | Status: DC
  Administered 2021-11-09 – 2021-11-10 (×2): 25 mg via ORAL
  Filled 2021-11-09 (×2): qty 1

## 2021-11-09 NOTE — TOC Progression Note (Signed)
Transition of Care [facility]) - Progression Note    Patient Details  Name: Casey Ramos MRN: [id number] Date of Birth: [birth year]  Transition of Care [facility]) CM/SW [place], [place] Phone Number: [phone] 11/09/2021, 9:40 AM  Clinical Narrative:     CSW attempted to call Maudie Mercury at [facility] and had to leave a message. CSW is attempting to secure pt's bed.   TOC team will continue to assist with discharge planning needs.     Expected Discharge Plan: Skilled Nursing Facility Barriers to Discharge: Continued Medical Work up  Expected Discharge Plan and Services Expected Discharge Plan: [place] In-house Referral: Clinical Social Work Discharge Planning Services: CM Consult Post Acute Care Choice: [place] arrangements for the past 2 months: Single Family Home                 DME Arranged: Oxygen DME Agency:  ([place] Patient.) Date DME Agency Contacted: 11/04/21 Time DME Agency Contacted: [phone] Representative spoke with at DME Agency: [name]             Social Determinants of Health (SDOH) Interventions Food Insecurity Interventions: Intervention Not Indicated Housing Interventions: Intervention Not Indicated Transportation Interventions: Intervention Not Indicated  Readmission Risk Interventions     View : No data to display.

## 2021-11-09 NOTE — Progress Notes (Signed)
Mobility Specialist Progress Note:   11/09/21 0942  Mobility  Activity Ambulated with assistance in room  Level of Assistance Standby assist, set-up cues, supervision of patient - no hands on  Assistive Device None  Distance Ambulated (ft) 20 ft  Activity Response Tolerated well  $Mobility charge 1 Mobility   Pt received in bed willing to participate in mobility. Complaints of overall pain. Left EOB with call bell in reach and all needs met.   [facility] [place] Mobility Specialist

## 2021-11-09 NOTE — Progress Notes (Signed)
   While up on the floor seeing another patient, I was notified about this patient. She was complaining of possible palpitations and anxiety and a vague sensation in her chest. RN suspects this is all anxiety. She is already on Xanax 0.5 twice daily. I went to bedside to see patient. She is resting comfortably when I walked in. She states that she needs something to help her relax or she is afraid she is going to have a heart attack. She described a vague discomfort in her chest. She states it is painful and feels like chest is "catching." She states she had this pain prior to admission and it is unchanged since her PCI. She is tachypneic on exam but no adventitious lung sounds. I agree with RN and think this is due to her anxiety. Will give another dose of Xanax 0.'5mg'$  (currently has it ordered as twice daily as needed and last dose was around 9:45am). Also recommended discussing treatment of anxiety with primary team. Please let us know if symptoms do not improve with the Xanax.  Darreld Mclean, PA-C 11/09/2021 4:38 PM

## 2021-11-09 NOTE — TOC Progression Note (Signed)
Transition of Care [facility]) - Progression Note    Patient Details  Name: Casey Ramos MRN: [id number] Date of Birth: February 21, 1956  Transition of Care [facility]) CM/SW [place], LCSW Phone Number: 11/09/2021, 2:56 PM  Clinical Narrative:     CSW spoke with Maudie Mercury for [facility] and she confirms bed for pt. CSW reached out to MD for estimate DC date and was informed most likely tomorrow. CSW spoke to pt and bedside and let her know that [place] has a bed and that CSW would started authorization for tomorrow. Pt's reference number for authorization  [id number].  TOC team will continue to assist with discharge planning needs.   Expected Discharge Plan: Skilled Nursing Facility Barriers to Discharge: Continued Medical Work up  Expected Discharge Plan and Services Expected Discharge Plan: [place] In-house Referral: Clinical Social Work Discharge Planning Services: CM Consult Post Acute Care Choice: [place] arrangements for the past 2 months: Single Family Home                 DME Arranged: Oxygen DME Agency:  ([place] Patient.) Date DME Agency Contacted: 11/04/21 Time DME Agency Contacted: 1030 Representative spoke with at DME Agency: [name]             Social Determinants of Health (SDOH) Interventions Food Insecurity Interventions: Intervention Not Indicated Housing Interventions: Intervention Not Indicated Transportation Interventions: Intervention Not Indicated  Readmission Risk Interventions     View : No data to display.
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
## 2021-11-10 LAB — GLUCOSE, CAPILLARY: Glucose-Capillary: 123 mg/dL — ABNORMAL HIGH (ref 70–99)

## 2021-11-10 LAB — DIGOXIN LEVEL: Digoxin Level: 0.4 ng/mL — ABNORMAL LOW (ref 0.8–2.0)

## 2021-11-10 MED ORDER — APIXABAN 5 MG PO TABS: 5.0000 mg | ORAL_TABLET | Freq: Two times a day (BID) | ORAL | Status: AC

## 2021-11-10 MED ORDER — AMIODARONE HCL 200 MG PO TABS: 200.0000 mg | ORAL_TABLET | Freq: Every day | ORAL | Status: AC

## 2021-11-10 MED ORDER — INSULIN ASPART 100 UNIT/ML IJ SOLN: 0.0000 [IU] | Freq: Three times a day (TID) | INTRAMUSCULAR | Status: DC

## 2021-11-10 MED ORDER — DIGOXIN 125 MCG PO TABS: 0.1250 mg | ORAL_TABLET | Freq: Every day | ORAL | Status: AC

## 2021-11-10 MED ORDER — ASPIRIN 81 MG PO CHEW: 81.0000 mg | CHEWABLE_TABLET | Freq: Every day | ORAL | 0 refills | Status: AC

## 2021-11-10 MED ORDER — INSULIN ASPART 100 UNIT/ML IJ SOLN: 0.0000 [IU] | Freq: Three times a day (TID) | INTRAMUSCULAR | Status: AC

## 2021-11-10 MED ORDER — INSULIN GLARGINE-YFGN 100 UNIT/ML ~~LOC~~ SOLN: 20.0000 [IU] | Freq: Every day | SUBCUTANEOUS | Status: AC

## 2021-11-10 MED ORDER — OXYCODONE HCL 5 MG PO TABS
5.0000 mg | ORAL_TABLET | Freq: Four times a day (QID) | ORAL | 0 refills | Status: AC | PRN
Start: 2021-11-10 — End: ?

## 2021-11-10 MED ORDER — METOPROLOL SUCCINATE ER 25 MG PO TB24: 25.0000 mg | ORAL_TABLET | Freq: Every day | ORAL | Status: DC

## 2021-11-10 MED ORDER — ALPRAZOLAM 0.5 MG PO TABS: 0.5000 mg | ORAL_TABLET | Freq: Two times a day (BID) | ORAL | 0 refills | Status: AC | PRN
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
## 2021-11-10 NOTE — Progress Notes (Addendum)
Pt refusing to go to SNF or have [place]. Pt cussing at RN that she's "been waiting 40 minutes for you and I want to get the f--- out of here now!!". RN tried to comfort pt but unsuccessful.  RN called niece prior to transport and confirmed portable O2 is in the car. Pt transferred to niece's car via wheelchair. Pt refused to wear O2 during the transport stating she doesn't always where it and that she'll be fine.

## 2021-11-10 NOTE — Care Management Important Message (Signed)
Important Message  Patient Details  Name: Casey Ramos MRN: [id number] Date of Birth: [birth year]   Medicare Important Message Given:  Yes     Shelda Altes 11/10/2021, 11:48 AM
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
## 2021-11-11 LAB — LIPOPROTEIN A (LPA): Lipoprotein (a): 144.3 nmol/L — ABNORMAL HIGH (ref <75.0)

## 2021-11-11 SURGERY — ECHOCARDIOGRAM, TRANSESOPHAGEAL
Anesthesia: General
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
## 2021-11-18 ENCOUNTER — Inpatient Hospital Stay (HOSPITAL_COMMUNITY): Admit: 2021-11-18 | Discharge: 2021-11-18 | Disposition: A | Discharge status: Home / Self Care | Payer: Medicare HMO

## 2021-11-18 ENCOUNTER — Telehealth (HOSPITAL_COMMUNITY): Payer: Self-pay | Admitting: *Deleted

## 2021-11-18 NOTE — Telephone Encounter (Signed)
Call attempted to confirm [facility] TOC appt 10 am on 11/18/21. HIPPA appropriate VM left with callback number.    Earnestine Leys, BSN, Clinical cytogeneticist Only

## 2021-12-16 ENCOUNTER — Other Ambulatory Visit: Payer: Self-pay

## 2021-12-16 NOTE — Progress Notes (Deleted)
Cardiology Office Note:    Date:  12/17/2021   ID:  Casey Ramos, DOB 1955-11-05, MRN [id number]  PCP:  Aldona Bar, MD  Cardiologist:  Shirlee More, MD    Referring MD: Aldona Bar, MD    ASSESSMENT:    1. Paroxysmal atrial fibrillation (HCC)   2. High risk medication use   3. On amiodarone therapy   4. Chronic anticoagulation   5. Coronary artery disease of native artery of native heart with stable angina pectoris ([place])   6. Ischemic cardiomyopathy   7. Hypertensive heart disease, unspecified whether heart failure present   8. Hypercholesterolemia   9. Pulmonary emphysema, unspecified emphysema type ([place])   10. Anemia, unspecified type    PLAN:    In order of problems listed above:  ***   Next appointment: ***   Medication Adjustments/Labs and Tests Ordered: Current medicines are reviewed at length with the patient today.  Concerns regarding medicines are outlined above.  No orders of the defined types were placed in this encounter.  No orders of the defined types were placed in this encounter.   No chief complaint on file.   History of Present Illness:    Casey Ramos is a 66 y.o. female with a hx of CAD with ischemic cardiomyopathy EF 30 to 35% COPD with ambulatory oxygen obstructive sleep apnea previous substance abuse and previous ST elevation MI with PCI stent mid and distal right coronary artery 12/05/2020 other problems include type 2 diabetes mellitus hypertensive heart disease hyperlipidemia.  Last seen by heart care 11/09/2021 during her recent hospitalization with ACS rapid atrial fibrillation.  High-sensitivity troponin was elevated 2073 potassium 3.8 creatinine 0.65 GFR greater than 60 cc hemoglobin 9.7 prior to discharge  Compliance with diet, lifestyle and medications: ***  She had a recent hospitalization with atrial fibrillation rapid ventricular response requiring loading with IV amiodarone and fortunately converted to sinus  rhythm.  She also underwent coronary angiography and PCI and stent mid left circumflex and right coronary artery.  During the hospitalization she had hypotension with discontinuation of Entresto.  Left heart catheterization 11/05/2021: Procedures  CORONARY STENT INTERVENTION  LEFT HEART CATH AND CORONARY ANGIOGRAPHY   Conclusion  1.  Moderate nonobstructive distal left main stenosis 2.  Moderate nonobstructive proximal LAD stenosis with a large vascular territory of LAD supply 3.  Severe in-stent restenosis in the proximal/ostial RCA treated successfully with PTCA and stenting (3.0 x 16 mm Synergy DES) 4.  Severe diffuse calcific mid circumflex/OM stenosis, treated successfully with PTCA and stenting of the mid left circumflex using a 2.75 x 24 mm Synergy DES and PTCA of the distal AV circumflex through the stent struts in order to keep the distal AV circumflex patent  Coronary Diagrams  Diagnostic Dominance: Right  Intervention   Recommendations: Aspirin 81 mg x 30 days, clopidogrel 75 mg 6 to 12 months as tolerated, okay to start apixaban tomorrow morning.  Echocardiogram [facility] 10/20/2021 showed EF of 35% mild mitral and mild tricuspid regurgitation. Past Medical History:  Diagnosis Date   Acute on chronic systolic CHF (congestive heart failure) ([place]) 11/03/2021   Asthma    Atrial fibrillation with RVR ([place]) 11/03/2021   Bartholin's gland abscess 11/13/2020   Benign neoplasm of skin or subcutaneous tissue 02/26/2016   Chronic toe pain, right foot 04/17/2019   Formatting of this note might be different from the original. First and second toes right   Coronary artery disease involving native coronary artery of native  heart without angina pectoris 12/05/2020   Formatting of this note is different from the original. STEMI with DES x 3 to RCA by Dr. Marijo File on 12/05/2020.  Moderate LMCA stenosis; 2 OMs subtotally occluded; mild to mod dz in LAD; mod to severe lesion in pRI.  ICM  with EF 30-35%.   Current smoker 02/12/2017   Depression    Diabetes mellitus without complication ([place]) 4/62/8638   Dyslipidemia 02/12/2017   Dyslipidemia associated with type 2 diabetes mellitus ([place]) 11/05/2021   Essential hypertension 02/12/2017   GERD (gastroesophageal reflux disease) 11/13/2020   H/O: CVA (cerebrovascular accident) 02/12/2017   Hammer toes of both feet 07/29/2015   History of COPD 11/05/2021   Hypercholesterolemia 11/13/2020   Hypertensive heart disease 02/12/2017   Hypokalemia 11/03/2021   Inclusion cyst 03/18/2016   Ingrowing nail 07/29/2015   Iron deficiency anemia 11/03/2021   Migraine headache 11/13/2020   NSTEMI (non-ST elevated myocardial infarction) ([place]) 11/03/2021   Other emphysema ([place]) 02/12/2017   PAD (peripheral artery disease) ([place]) 04/17/2019   Poorly controlled type 2 diabetes mellitus with peripheral neuropathy ([place]) 04/17/2019   Respiratory arrest ([place]) 12/05/2020   RLS (restless legs syndrome) 11/13/2020   Status post coronary artery stent placement 12/05/2020   Formatting of this note might be different from the original. 2.5 x 48 mm Synergy drug-eluting stent was placed in the mid right coronary artery. A 2.5 x 24 mm Synergy drug-eluting stent was placed in the proximal right coronary artery A 2.25 by 20 mm Synergy drug-eluting stent was placed in the posterior AV groove branch.   STEMI (ST elevation myocardial infarction) ([place]) 12/05/2020    Past Surgical History:  Procedure Laterality Date   ABDOMINAL HYSTERECTOMY     CHOLECYSTECTOMY     CORONARY STENT INTERVENTION N/A 11/05/2021   Procedure: CORONARY STENT INTERVENTION;  Surgeon: Sherren Mocha, MD;  Location: [place] [facility];  Service: Cardiovascular;  Laterality: N/A;   FOOT SURGERY     ICD IMPLANT N/A 03/14/2021   Procedure: ICD IMPLANT;  Surgeon: Constance Haw, MD;  Location: [place] [facility];  Service: Cardiovascular;  Laterality: N/A;   KNEE SURGERY Right    LEFT HEART CATH AND CORONARY  ANGIOGRAPHY N/A 11/05/2021   Procedure: LEFT HEART CATH AND CORONARY ANGIOGRAPHY;  Surgeon: Sherren Mocha, MD;  Location: [place] [facility];  Service: Cardiovascular;  Laterality: N/A;   SHOULDER SURGERY Left     Current Medications: No outpatient medications have been marked as taking for the 12/17/21 encounter (Appointment) with Richardo Priest, MD.     Allergies:   Ibuprofen, Morphine, Hydrocodone-acetaminophen, Other, Imitrex [sumatriptan], Naprosyn [naproxen], Pineapple, Chantix [varenicline], Codeine, and Tylenol [acetaminophen]   Social History   Socioeconomic History   Marital status: Widowed    Spouse name: Not on file   Number of children: 1   Years of education: Not on file   Highest education level: 11th grade  Occupational History    Comment: retires  Tobacco Use   Smoking status: Every Day    Packs/day: 0.50    Years: 67.00    Total pack years: 33.50    Types: Cigarettes   Smokeless tobacco: Never  Vaping Use   Vaping Use: Never used  Substance and Sexual Activity   Alcohol use: Not Currently   Drug use: Yes    Types: Marijuana    Comment: past   Sexual activity: Not on file  Other Topics Concern   Not on file  Social History  Narrative   Not on file   Social Determinants of Health   Financial Resource Strain: Not on file  Food Insecurity: No Food Insecurity (11/06/2021)   Hunger Vital Sign    Worried About Running Out of Food in the Last Year: Never true    Ran Out of Food in the Last Year: Never true  Transportation Needs: No Transportation Needs (11/06/2021)   PRAPARE - Hydrologist (Medical): No    Lack of Transportation (Non-Medical): No  Physical Activity: Not on file  Stress: Not on file  Social Connections: Not on file     Family History: The patient's ***family history includes Alcoholism in her brother; Diabetes in her brother, mother, and sister; Failure to thrive in her brother; Heart attack in her sister;  Heart attack (age of onset: 59) in her mother; Hypertension in her brother, mother, and sister; Other in her father; Stroke in her brother. ROS:   Please see the history of present illness.    All other systems reviewed and are negative.  EKGs/Labs/Other Studies Reviewed:    The following studies were reviewed today:  EKG:  EKG ordered today and personally reviewed.  The ekg ordered today demonstrates ***  Recent Labs: 11/03/2021: ALT 17; B Natriuretic Peptide 717.8 11/06/2021: Hemoglobin 9.7; Platelets 255 11/08/2021: Magnesium 1.9; TSH 2.505 11/09/2021: BUN 13; Creatinine, Ser 0.65; Potassium 3.8; Sodium 140  Recent Lipid Panel    Component Value Date/Time   CHOL 271 (H) 03/21/2021 1038   TRIG 115 03/21/2021 1038   HDL 67 03/21/2021 1038   CHOLHDL 4.0 03/21/2021 1038   LDLCALC 184 (H) 03/21/2021 1038    Physical Exam:    VS:  LMP  (LMP Unknown)     Wt Readings from Last 3 Encounters:  11/10/21 142 lb 6.4 oz (64.6 kg)  06/16/21 155 lb (70.3 kg)  04/17/21 160 lb (72.6 kg)     GEN: *** Well nourished, well developed in no acute distress HEENT: Normal NECK: No JVD; No carotid bruits LYMPHATICS: No lymphadenopathy CARDIAC: ***RRR, no murmurs, rubs, gallops RESPIRATORY:  Clear to auscultation without rales, wheezing or rhonchi  ABDOMEN: Soft, non-tender, non-distended MUSCULOSKELETAL:  No edema; No deformity  SKIN: Warm and dry NEUROLOGIC:  Alert and oriented x 3 PSYCHIATRIC:  Normal affect    Signed, Shirlee More, MD  12/17/2021 7:42 AM    [place]

## 2021-12-17 ENCOUNTER — Ambulatory Visit: Payer: Medicare HMO | Admitting: Cardiology

## 2021-12-31 DIAGNOSIS — I4891 Unspecified atrial fibrillation: Secondary | ICD-10-CM | POA: Diagnosis not present

## 2022-01-20 DIAGNOSIS — I5021 Acute systolic (congestive) heart failure: Secondary | ICD-10-CM

## 2022-01-20 DIAGNOSIS — J441 Chronic obstructive pulmonary disease with (acute) exacerbation: Secondary | ICD-10-CM

## 2022-01-20 DIAGNOSIS — I4891 Unspecified atrial fibrillation: Secondary | ICD-10-CM

## 2022-01-20 DIAGNOSIS — E785 Hyperlipidemia, unspecified: Secondary | ICD-10-CM

## 2022-01-20 DIAGNOSIS — I10 Essential (primary) hypertension: Secondary | ICD-10-CM

## 2022-01-21 DIAGNOSIS — E785 Hyperlipidemia, unspecified: Secondary | ICD-10-CM | POA: Diagnosis not present

## 2022-01-21 DIAGNOSIS — I5021 Acute systolic (congestive) heart failure: Secondary | ICD-10-CM | POA: Diagnosis not present

## 2022-01-21 DIAGNOSIS — I4891 Unspecified atrial fibrillation: Secondary | ICD-10-CM | POA: Diagnosis not present

## 2022-01-21 DIAGNOSIS — I4892 Unspecified atrial flutter: Secondary | ICD-10-CM

## 2022-01-21 DIAGNOSIS — J441 Chronic obstructive pulmonary disease with (acute) exacerbation: Secondary | ICD-10-CM | POA: Diagnosis not present

## 2022-01-22 DIAGNOSIS — I4891 Unspecified atrial fibrillation: Secondary | ICD-10-CM | POA: Diagnosis not present

## 2022-01-22 DIAGNOSIS — J441 Chronic obstructive pulmonary disease with (acute) exacerbation: Secondary | ICD-10-CM | POA: Diagnosis not present

## 2022-01-22 DIAGNOSIS — I5021 Acute systolic (congestive) heart failure: Secondary | ICD-10-CM | POA: Diagnosis not present

## 2022-01-22 DIAGNOSIS — E785 Hyperlipidemia, unspecified: Secondary | ICD-10-CM | POA: Diagnosis not present

## 2022-01-23 DIAGNOSIS — J441 Chronic obstructive pulmonary disease with (acute) exacerbation: Secondary | ICD-10-CM | POA: Diagnosis not present

## 2022-01-23 DIAGNOSIS — I4891 Unspecified atrial fibrillation: Secondary | ICD-10-CM | POA: Diagnosis not present

## 2022-01-23 DIAGNOSIS — E785 Hyperlipidemia, unspecified: Secondary | ICD-10-CM | POA: Diagnosis not present

## 2022-01-23 DIAGNOSIS — I5021 Acute systolic (congestive) heart failure: Secondary | ICD-10-CM | POA: Diagnosis not present

## 2022-01-24 DIAGNOSIS — J440 Chronic obstructive pulmonary disease with acute lower respiratory infection: Secondary | ICD-10-CM

## 2022-01-24 DIAGNOSIS — I4891 Unspecified atrial fibrillation: Secondary | ICD-10-CM

## 2022-01-25 DIAGNOSIS — J440 Chronic obstructive pulmonary disease with acute lower respiratory infection: Secondary | ICD-10-CM | POA: Diagnosis not present

## 2022-01-25 DIAGNOSIS — I4891 Unspecified atrial fibrillation: Secondary | ICD-10-CM | POA: Diagnosis not present

## 2022-01-26 DIAGNOSIS — J440 Chronic obstructive pulmonary disease with acute lower respiratory infection: Secondary | ICD-10-CM | POA: Diagnosis not present

## 2022-01-26 DIAGNOSIS — I4891 Unspecified atrial fibrillation: Secondary | ICD-10-CM | POA: Diagnosis not present

## 2022-02-06 DEATH — deceased

## 2022-05-22 IMAGING — CT CT HEAD W/O CM
4 series · 16 of 47 positions shown, 18 images · non-contrast
Comparison: CT brain 12/05/2020

CLINICAL DATA: Fell out of bed

EXAM:
CT HEAD WITHOUT CONTRAST
CT CERVICAL SPINE WITHOUT CONTRAST
TECHNIQUE: Multidetector CT imaging of the head and cervical spine was
performed following the standard protocol without intravenous
contrast. Multiplanar CT image reconstructions of the cervical spine
were also generated.

[Series 3: head wo · axial · 0.44mm/px · z∈[-598,-468]mm · 7 of 36 slices shown, 9 images]
[im 5/36  brain]
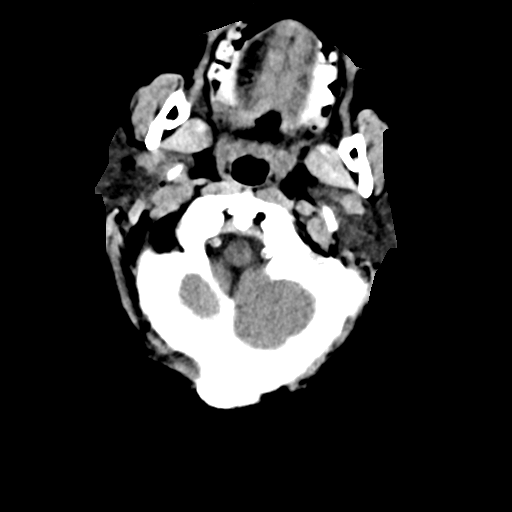
[im 5/36  bone]
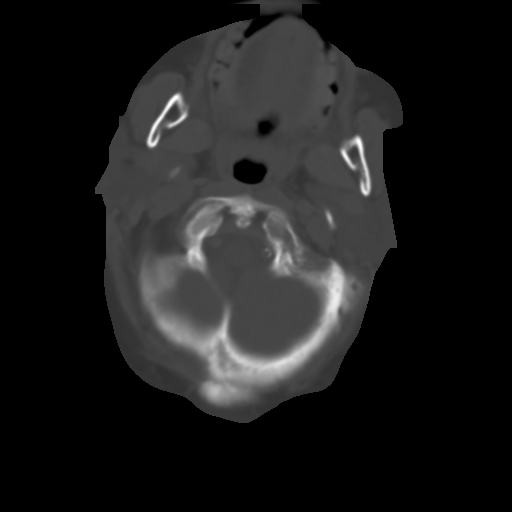
[im 9/36  brain]
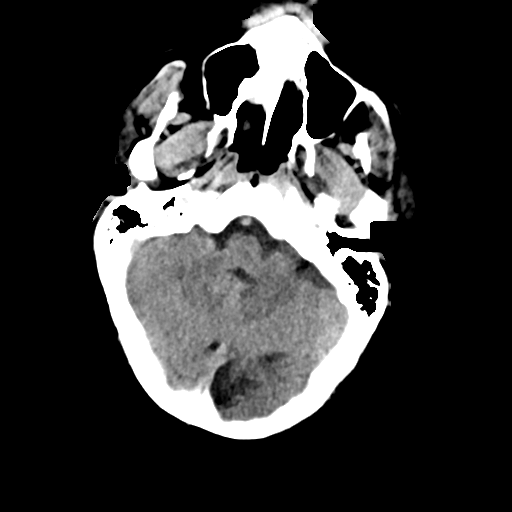
[im 14/36  brain]
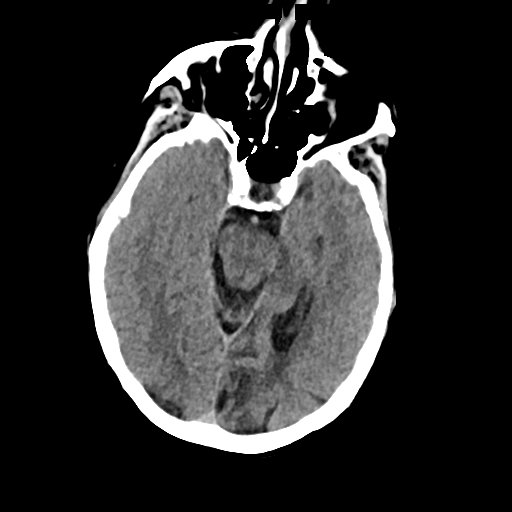
[im 18/36  brain]
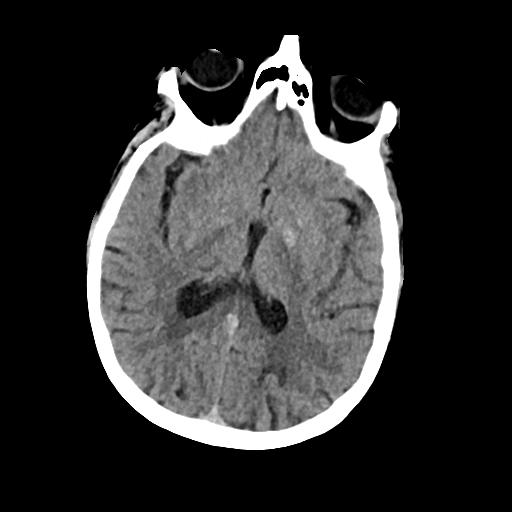
[im 22/36  brain]
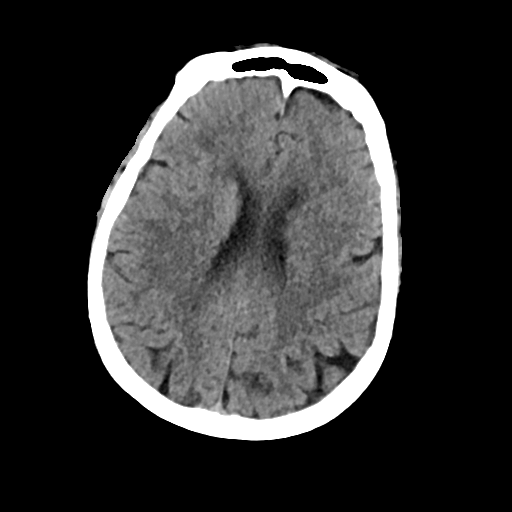
[im 22/36  bone]
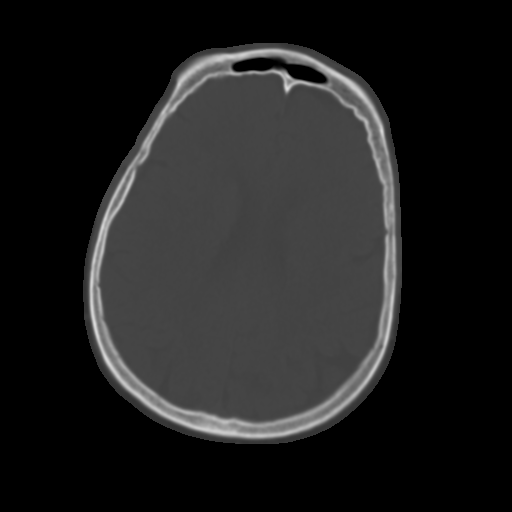
[im 27/36  brain]
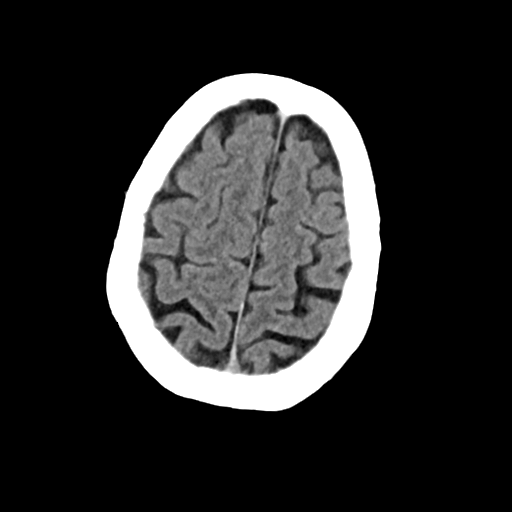
[im 31/36  brain]
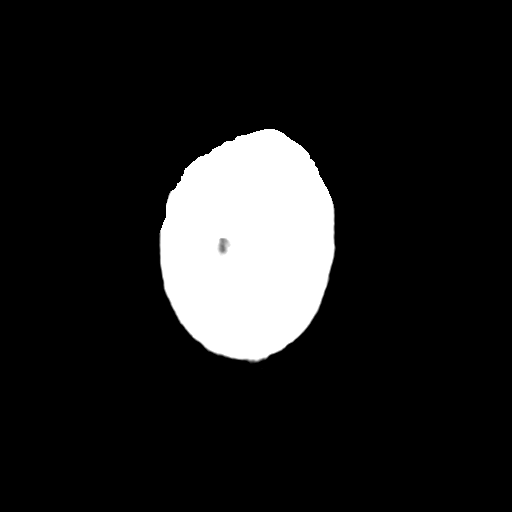

[Series 4: head bone · axial · 0.44mm/px · z∈[-602,-566]mm · 3 of 90 slices shown]
[im 9/90  bone]
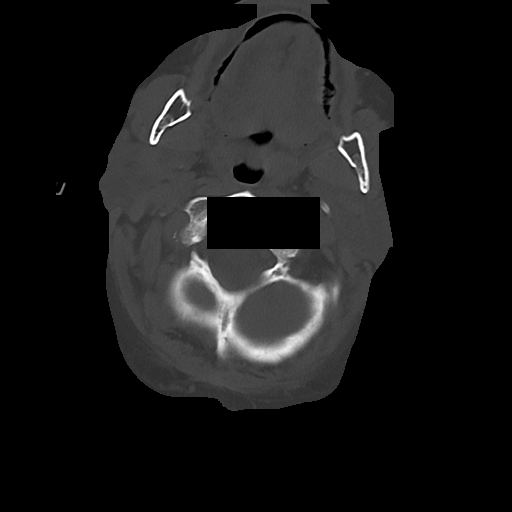
[im 18/90  bone]
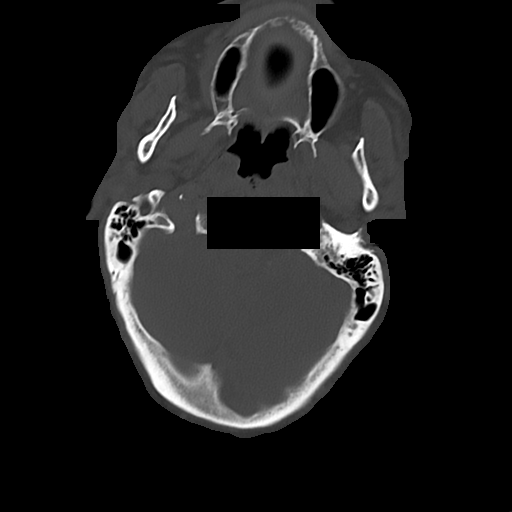
[im 27/90  bone]
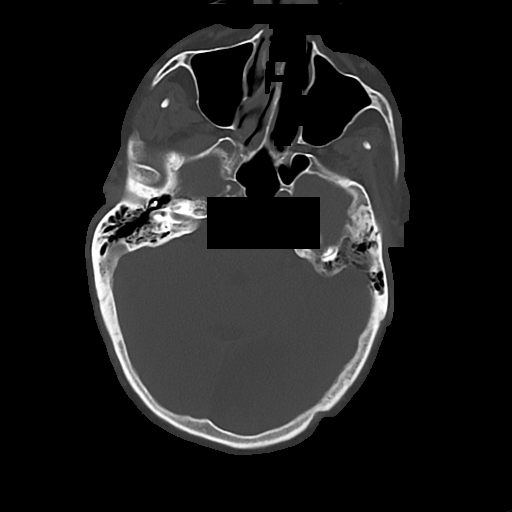

[Series 5: cor soft · coronal · 0.35mm/px · 3 of 71 slices shown]
[im 24/71  brain]
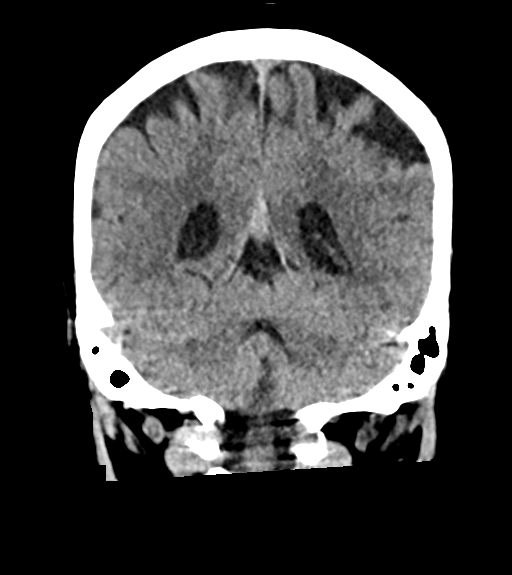
[im 32/71  brain]
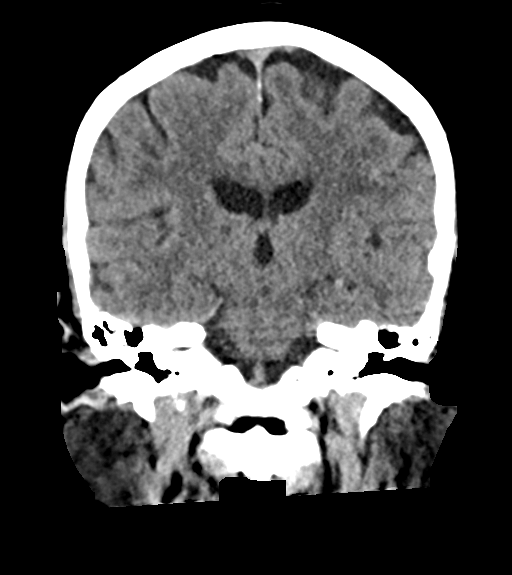
[im 39/71  brain]
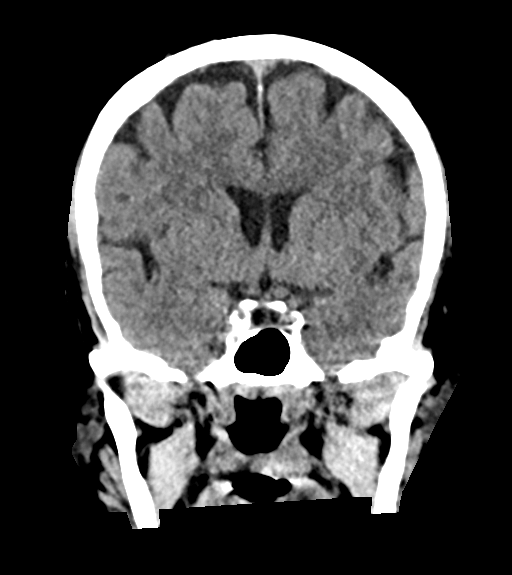

[Series 6: sag soft · sagittal · 0.39mm/px · 3 of 60 slices shown]
[im 20/60  brain]
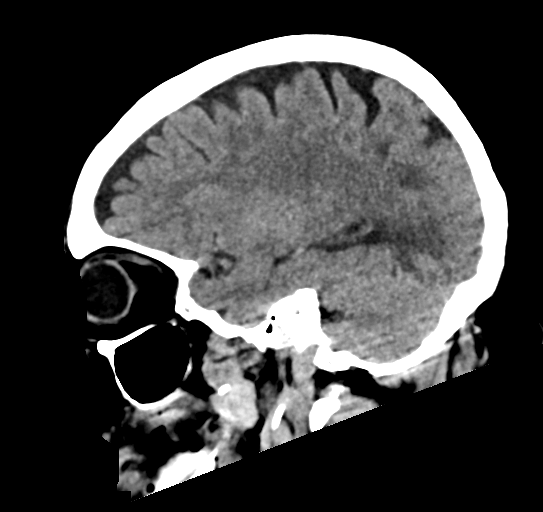
[im 30/60  brain]
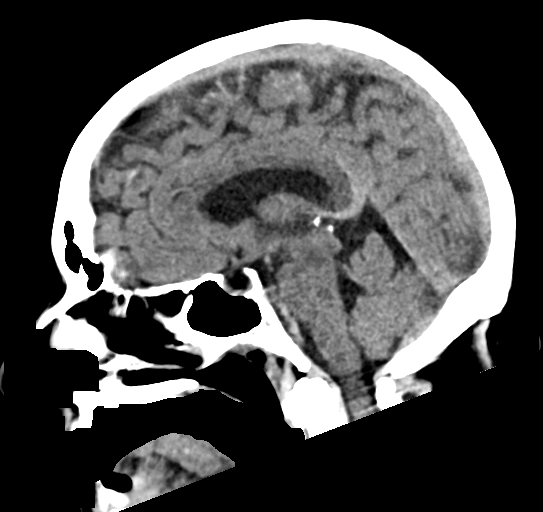
[im 40/60  brain]
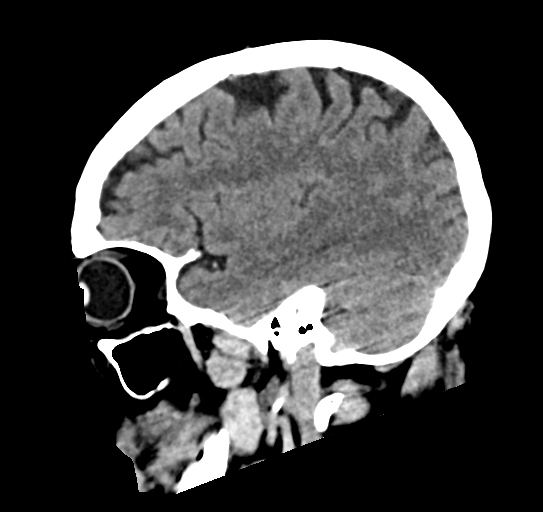

[16 of 47 positions shown; findings below may reference images not displayed]

FINDINGS: CT HEAD FINDINGS

Brain: No acute territorial infarction, hemorrhage, or intracranial
mass. Mild atrophy. Mild chronic small vessel ischemic changes of
the white matter. Chronic left occipital encephalomalacia/infarct.
Chronic right thalamic infarct. Stable ventricle size.

Vascular: No hyperdense vessels. Vertebral and carotid vascular
calcification

Skull: Normal. Negative for fracture or focal lesion.

Sinuses/Orbits: Patchy mucosal thickening in the sinuses

Other: None

CT CERVICAL SPINE FINDINGS

Alignment: Motion degradation. No subluxation. Facet alignment
within normal limits.

Skull base and vertebrae: No acute fracture. No primary bone lesion
or focal pathologic process.

Soft tissues and spinal canal: No prevertebral fluid or swelling. No
visible canal hematoma.

Disc levels: Mild disc space narrowing and degenerative change at
multiple levels. Facet degenerative change at multiple levels.

Upper chest: Negative.

Other: None
IMPRESSION: 1. No CT evidence for acute intracranial abnormality. Chronic left
occipital and right thalamic infarcts. Mild atrophy
2. Motion degradation of the cervical spine slightly limits
assessment. No definite acute osseous abnormality

## 2022-12-09 IMAGING — DX DG CHEST 1V PORT
1 series · 1 of 1 positions shown · non-contrast
Comparison: Chest x-ray 11/03/2021.

CLINICAL DATA: 65-year-old female with history of shortness of
breath.

EXAM:
PORTABLE CHEST 1 VIEW

[chest]
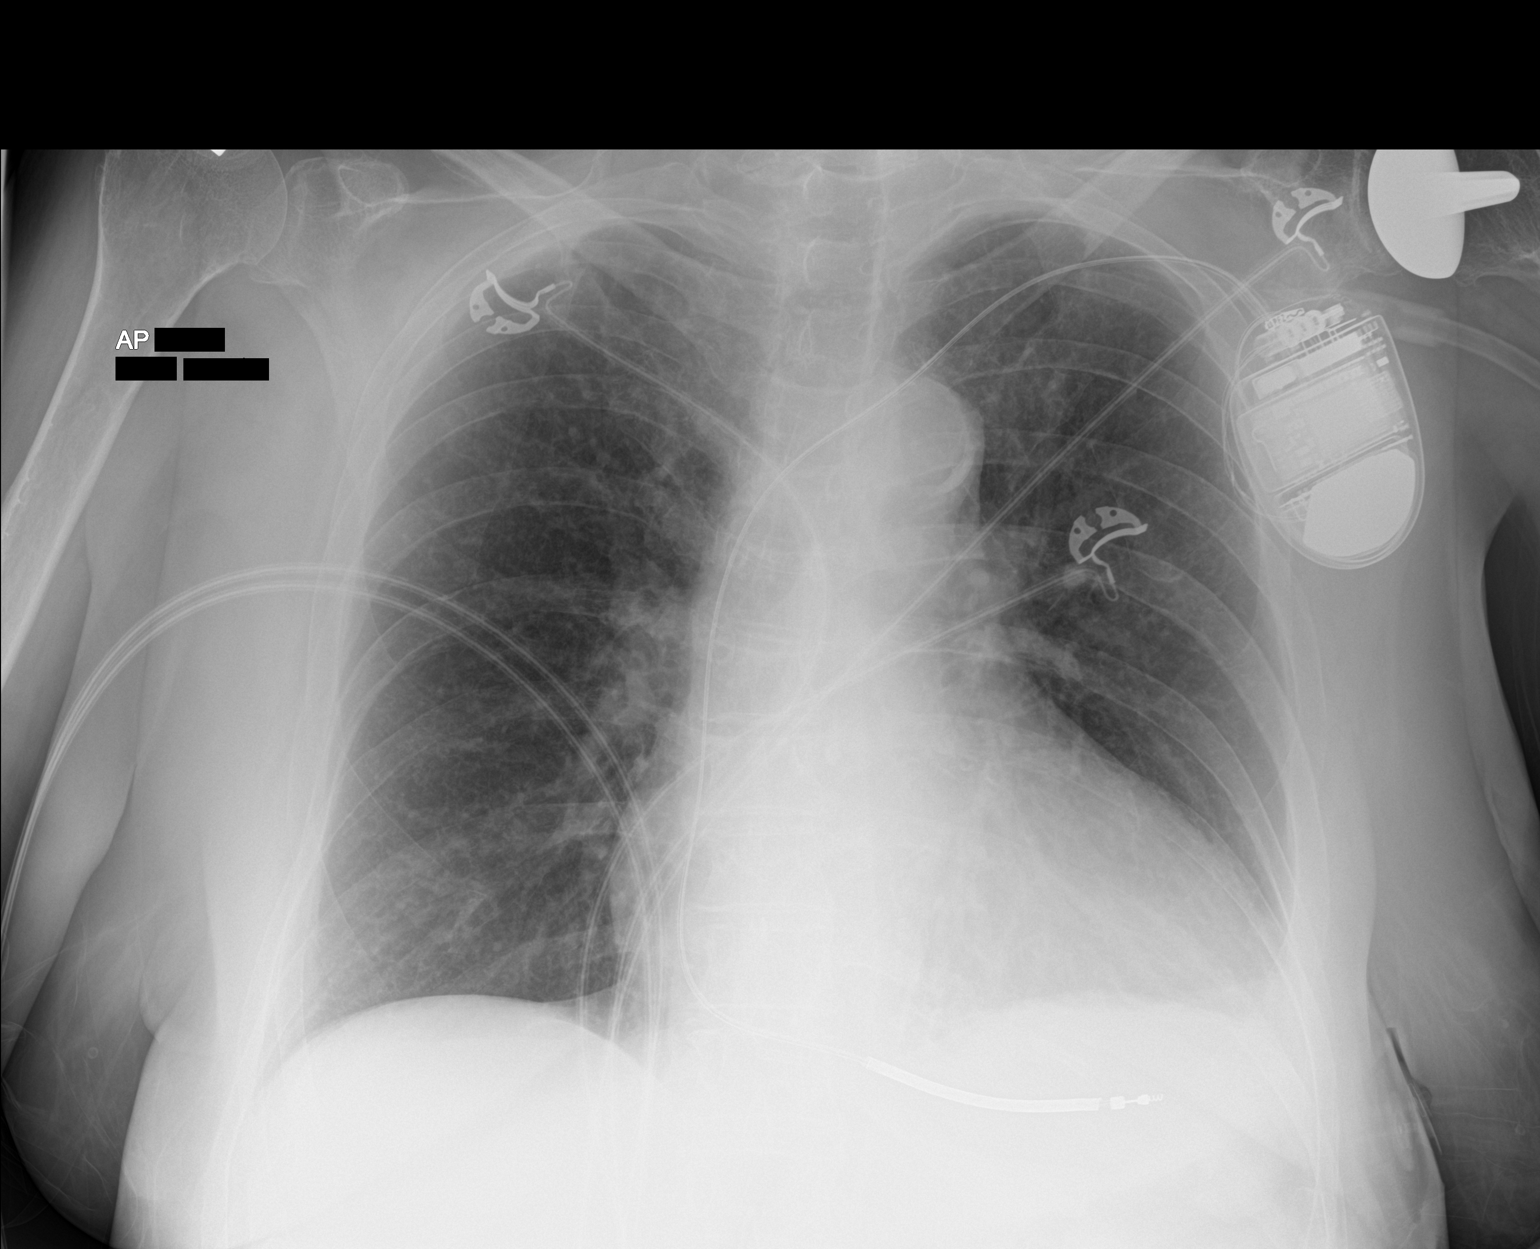

[1 of 1 positions shown; findings below may reference images not displayed]

FINDINGS: Mild blunting of the left costophrenic sulcus may suggest a small
left pleural effusion. No right pleural effusion. No consolidative
airspace disease. No pneumothorax. No evidence of pulmonary edema.
Mild cardiomegaly. Upper mediastinal contours are within normal
limits. Atherosclerotic calcifications in the thoracic aorta.
Left-sided pacemaker/AICD with lead tip projecting over the expected
location of the right ventricular apex. Status post left shoulder
hemiarthroplasty.
IMPRESSION: 1. Small left pleural effusion.
2. Mild cardiomegaly.
3. Aortic atherosclerosis.

## 2024-04-21 ENCOUNTER — Telehealth: Payer: Self-pay

## 2024-04-21 NOTE — Telephone Encounter (Signed)
 Patient is overdue and needs apt. Please place in apt notes to discuss remote monitoring reconnection.

## 2024-05-24 ENCOUNTER — Encounter: Payer: Self-pay | Admitting: Emergency Medicine

## 2024-05-24 NOTE — Telephone Encounter (Signed)
 Call x1; attempted call to patient to schedule overdue f/u since LOV 06/2021 - number on file, which is also the same number for patients EC and on DPR, is not in service.   Certified letter sent to patient.

## 2024-09-12 ENCOUNTER — Ambulatory Visit: Payer: Self-pay

## 2024-12-12 ENCOUNTER — Ambulatory Visit: Payer: Self-pay

## 2025-03-13 ENCOUNTER — Ambulatory Visit: Payer: Self-pay
